# Patient Record
Sex: Female | Born: 1980 | Race: Black or African American | Hispanic: No | Marital: Single | Smoking: Former smoker
Health system: Southern US, Community
[De-identification: ages and names within clinical notes are randomized; demographics above are authoritative.]

## PROBLEM LIST (undated history)

## (undated) ENCOUNTER — Inpatient Hospital Stay (HOSPITAL_COMMUNITY): Payer: Self-pay

## (undated) DIAGNOSIS — T81509A Unspecified complication of foreign body accidentally left in body following unspecified procedure, initial encounter: Secondary | ICD-10-CM

## (undated) DIAGNOSIS — Z8619 Personal history of other infectious and parasitic diseases: Secondary | ICD-10-CM

## (undated) DIAGNOSIS — Z789 Other specified health status: Secondary | ICD-10-CM

## (undated) HISTORY — PX: TOOTH EXTRACTION: SUR596

## (undated) HISTORY — DX: Unspecified complication of foreign body accidentally left in body following unspecified procedure, initial encounter: T81.509A

## (undated) HISTORY — DX: Personal history of other infectious and parasitic diseases: Z86.19

## (undated) SURGERY — Surgical Case
Anesthesia: *Unknown

---

## 2013-03-05 ENCOUNTER — Encounter (HOSPITAL_BASED_OUTPATIENT_CLINIC_OR_DEPARTMENT_OTHER): Payer: Self-pay | Admitting: Emergency Medicine

## 2013-03-05 ENCOUNTER — Emergency Department (HOSPITAL_BASED_OUTPATIENT_CLINIC_OR_DEPARTMENT_OTHER)
Admission: EM | Admit: 2013-03-05 | Discharge: 2013-03-05 | Disposition: A | Discharge status: Home / Self Care | Payer: BC Managed Care – PPO | Attending: Emergency Medicine | Admitting: Emergency Medicine

## 2013-03-05 DIAGNOSIS — K006 Disturbances in tooth eruption: Secondary | ICD-10-CM | POA: Insufficient documentation

## 2013-03-05 DIAGNOSIS — K029 Dental caries, unspecified: Secondary | ICD-10-CM | POA: Insufficient documentation

## 2013-03-05 DIAGNOSIS — K089 Disorder of teeth and supporting structures, unspecified: Secondary | ICD-10-CM | POA: Insufficient documentation

## 2013-03-05 DIAGNOSIS — F17200 Nicotine dependence, unspecified, uncomplicated: Secondary | ICD-10-CM | POA: Insufficient documentation

## 2013-03-05 DIAGNOSIS — R51 Headache: Secondary | ICD-10-CM | POA: Insufficient documentation

## 2013-03-05 DIAGNOSIS — K0889 Other specified disorders of teeth and supporting structures: Secondary | ICD-10-CM

## 2013-03-05 MED ORDER — OXYCODONE-ACETAMINOPHEN 5-325 MG PO TABS: 2.0000 | ORAL_TABLET | ORAL | Status: DC | PRN

## 2013-03-05 MED ORDER — OXYCODONE-ACETAMINOPHEN 5-325 MG PO TABS
2.0000 | ORAL_TABLET | Freq: Once | ORAL | Status: AC
  Administered 2013-03-05: 2 via ORAL
  Filled 2013-03-05: qty 2

## 2013-03-05 MED ORDER — PENICILLIN V POTASSIUM 500 MG PO TABS: 500.0000 mg | ORAL_TABLET | Freq: Three times a day (TID) | ORAL | Status: DC

## 2013-03-05 NOTE — ED Notes (Signed)
Patient has cracked tooth  Lower right side

## 2013-03-05 NOTE — ED Provider Notes (Signed)
CSN: [id number]     Arrival date & time 03/05/13  [id number] History   First MD Initiated Contact with Patient 03/05/13 [phone]     No chief complaint on file.  (Consider location/radiation/quality/duration/timing/severity/associated sxs/prior Treatment) Patient is a 33 y.o. female presenting with tooth pain.  Dental Pain Location:  Lower Lower teeth location:  31/RL 2nd molar Quality:  Aching Severity:  Moderate Onset quality:  Gradual Timing:  Constant Progression:  Worsening Chronicity:  Recurrent Context: dental caries and poor dentition   Context: not abscess, cap still on, not crown fracture, not recent dental surgery and not trauma   Previous work-up:  Dental exam and root canal Relieved by:  Nothing Worsened by:  Cold food/drink Ineffective treatments:  Acetaminophen and NSAIDs Associated symptoms: headaches   Associated symptoms: no difficulty swallowing, no drooling, no facial pain, no facial swelling, no fever, no gum swelling, no neck pain, no neck swelling, no oral bleeding, no oral lesions and no trismus   Risk factors: lack of dental care and smoking   Risk factors: no alcohol problem and no chewing tobacco use     No past medical history on file. No past surgical history on file. No family history on file. History  Substance Use Topics  . Smoking status: Not on file  . Smokeless tobacco: Not on file  . Alcohol Use: Not on file   OB History   No data available     Review of Systems  Constitutional: Negative for fever.  HENT: Negative for drooling, facial swelling and mouth sores.   Musculoskeletal: Negative for neck pain.  Neurological: Positive for headaches.  All other systems reviewed and are negative.    Allergies  Review of patient's allergies indicates not on file.  Home Medications   Current Outpatient Rx  Name  Route  Sig  Dispense  Refill  . oxyCODONE-acetaminophen (PERCOCET/ROXICET) 5-325 MG per tablet   Oral   Take 2 tablets by mouth every 4  (four) hours as needed for severe pain.   15 tablet   0   . penicillin v potassium (VEETID) 500 MG tablet   Oral   Take 1 tablet (500 mg total) by mouth 3 (three) times daily.   30 tablet   0    BP 116/77  Pulse 78  Temp(Src) 98.4 F (36.9 C) (Oral)  Resp 16  Ht 5\' 7"  (1.702 m)  Wt 222 lb (100.699 kg)  BMI 34.76 kg/m2  SpO2 100% Physical Exam  Nursing note and vitals reviewed. Constitutional: She is oriented to person, place, and time. She appears well-developed and well-nourished.  HENT:  Head: Normocephalic and atraumatic.  Right Ear: External ear normal.  Left Ear: External ear normal.  Nose: Nose normal.  Mouth/Throat: Uvula is midline, oropharynx is clear and moist and mucous membranes are normal.    Caries right lower third molar and second molar  Eyes: Conjunctivae and EOM are normal. Pupils are equal, round, and reactive to light.  Neck: Normal range of motion. Neck supple. No JVD present. No tracheal deviation present. No thyromegaly present.  Cardiovascular: Normal rate.   Pulmonary/Chest: Effort normal and breath sounds normal.  Abdominal: Soft.  Musculoskeletal: Normal range of motion.  Lymphadenopathy:    She has no cervical adenopathy.  Neurological: She is alert and oriented to person, place, and time. She has normal reflexes. She displays normal reflexes. No cranial nerve deficit. She exhibits normal muscle tone. Coordination normal.  Skin: Skin is warm and dry.  Psychiatric:  She has a normal mood and affect. Her behavior is normal. Judgment and thought content normal.    ED Course  Procedures (including critical care time) Labs Review Labs Reviewed - No data to display Imaging Review No results found.  EKG Interpretation   None       MDM   1. Dental caries   2. Toothache   Patient states she is in contact with dentist and has appointment February, 24.  Plan penicillin percocet.  Discussed smoking cessation  Voices understanding of plan  and return precautions.     Hilario Quarry, MD 03/05/13 [phone]

## 2013-03-05 NOTE — Discharge Instructions (Signed)
Dental Caries Dental caries is tooth decay. This decay can cause a hole in teeth (cavity) that can get bigger and deeper over time. HOME CARE  Brush and floss your teeth. Do this at least two times a day.  Use a fluoride toothpaste.  Use a mouth rinse if told by your dentist or doctor.  Eat less sugary and starchy foods. Drink less sugary drinks.  Avoid snacking often on sugary and starchy foods. Avoid sipping often on sugary drinks.  Keep regular checkups and cleanings with your dentist.  Use fluoride supplements if told by your dentist or doctor.  Allow fluoride to be applied to teeth if told by your dentist or doctor. MAKE SURE YOU:  Understand these instructions.  Will watch your condition. Will get help right away if you are not doing well or getToothache Toothaches are usually caused by tooth decay (cavity). However, other causes of toothache include: Gum disease. Cracked tooth. Cracked filling. Injury. Jaw problem (temporo mandibular joint or TMJ disorder). Tooth abscess. Root sensitivity. Grinding. Eruption problems. Swelling and redness around a painful tooth often means you have a dental abscess. Pain medicine and antibiotics can help reduce symptoms, but you will need to see a dentist within the next few days to have your problem properly evaluated and treated. If tooth decay is the problem, you may need a filling or root canal to save your tooth. If the problem is more severe, your tooth may need to be pulled. SEEK IMMEDIATE MEDICAL CARE IF: You cannot swallow. You develop severe swelling, increased redness, or increased pain in your mouth or face. You have a fever. You cannot open your mouth adequately. Document Released: 03/03/2004 Document Revised: 04/18/2011 Document Reviewed: 04/23/2009 [facility]Exit[facility] Patient Information 2014 [place], [place].  orse. Document Released: 11/03/2007 Document Revised: 09/26/2012 Document Reviewed: 01/27/2012 [facility] Patient  Information 2014 [place], [place].

## 2013-03-19 ENCOUNTER — Encounter (HOSPITAL_BASED_OUTPATIENT_CLINIC_OR_DEPARTMENT_OTHER): Payer: Self-pay | Admitting: Emergency Medicine

## 2013-03-19 ENCOUNTER — Emergency Department (HOSPITAL_BASED_OUTPATIENT_CLINIC_OR_DEPARTMENT_OTHER)
Admission: EM | Admit: 2013-03-19 | Discharge: 2013-03-19 | Disposition: A | Discharge status: Home / Self Care | Payer: BC Managed Care – PPO | Attending: Emergency Medicine | Admitting: Emergency Medicine

## 2013-03-19 DIAGNOSIS — T360X5A Adverse effect of penicillins, initial encounter: Secondary | ICD-10-CM | POA: Insufficient documentation

## 2013-03-19 DIAGNOSIS — R42 Dizziness and giddiness: Secondary | ICD-10-CM | POA: Insufficient documentation

## 2013-03-19 DIAGNOSIS — Z792 Long term (current) use of antibiotics: Secondary | ICD-10-CM | POA: Insufficient documentation

## 2013-03-19 DIAGNOSIS — R221 Localized swelling, mass and lump, neck: Principal | ICD-10-CM

## 2013-03-19 DIAGNOSIS — R21 Rash and other nonspecific skin eruption: Secondary | ICD-10-CM | POA: Insufficient documentation

## 2013-03-19 DIAGNOSIS — T7840XA Allergy, unspecified, initial encounter: Secondary | ICD-10-CM

## 2013-03-19 DIAGNOSIS — R1310 Dysphagia, unspecified: Secondary | ICD-10-CM | POA: Insufficient documentation

## 2013-03-19 DIAGNOSIS — R062 Wheezing: Secondary | ICD-10-CM | POA: Insufficient documentation

## 2013-03-19 DIAGNOSIS — R220 Localized swelling, mass and lump, head: Secondary | ICD-10-CM | POA: Insufficient documentation

## 2013-03-19 DIAGNOSIS — F17200 Nicotine dependence, unspecified, uncomplicated: Secondary | ICD-10-CM | POA: Insufficient documentation

## 2013-03-19 MED ORDER — TRAMADOL HCL 50 MG PO TABS: 50.0000 mg | ORAL_TABLET | Freq: Four times a day (QID) | ORAL | Status: DC | PRN

## 2013-03-19 MED ORDER — METHYLPREDNISOLONE SODIUM SUCC 125 MG IJ SOLR
125.0000 mg | Freq: Once | INTRAMUSCULAR | Status: AC
  Administered 2013-03-19: 125 mg via INTRAVENOUS
  Filled 2013-03-19: qty 2

## 2013-03-19 MED ORDER — TRAMADOL HCL 50 MG PO TABS
50.0000 mg | ORAL_TABLET | Freq: Once | ORAL | Status: AC
  Administered 2013-03-19: 50 mg via ORAL
  Filled 2013-03-19: qty 1

## 2013-03-19 MED ORDER — SODIUM CHLORIDE 0.9 % IV SOLN
Freq: Once | INTRAVENOUS | Status: AC
  Administered 2013-03-19: 09:00:00 via INTRAVENOUS

## 2013-03-19 MED ORDER — HYDROXYZINE HCL 25 MG PO TABS: 25.0000 mg | ORAL_TABLET | Freq: Four times a day (QID) | ORAL | Status: DC

## 2013-03-19 MED ORDER — DIPHENHYDRAMINE HCL 50 MG/ML IJ SOLN
25.0000 mg | Freq: Once | INTRAMUSCULAR | Status: AC
  Administered 2013-03-19: 25 mg via INTRAVENOUS
  Filled 2013-03-19: qty 1

## 2013-03-19 MED ORDER — FAMOTIDINE IN NACL 20-0.9 MG/50ML-% IV SOLN
20.0000 mg | Freq: Once | INTRAVENOUS | Status: AC
  Administered 2013-03-19: 20 mg via INTRAVENOUS
  Filled 2013-03-19: qty 50

## 2013-03-19 MED ORDER — PREDNISONE 20 MG PO TABS: ORAL_TABLET | ORAL | Status: DC

## 2013-03-19 NOTE — ED Provider Notes (Signed)
CSN: [id number]     Arrival date & time 03/19/13  [id number] History   First MD Initiated Contact with Patient 03/19/13 0756     Chief Complaint  Patient presents with  . Allergic Reaction     (Consider location/radiation/quality/duration/timing/severity/associated sxs/prior Treatment) HPI Comments: Patient presents with rash and itching. She feels it's allergic reaction to amoxicillin. She recently took a course of penicillin for tooth ache and did not have any problems with that. She went to her dentist last week and was started on amoxicillin and 4 days ago. Shortly after that she started noticing a rash with itching to both of her arms and on her lower legs. She states her hands it started to swell up. Two days ago, she started noticing some fullness to her throat and difficulty swallowing. She denies any swelling to her lips or tongue. She does report some intermittent slight wheezing. She denies any history of allergic reactions in the past. She's been using Benadryl with some relief of the rash no improvement in the swelling. She denies any other new exposures other than the amoxicillin.  Patient is a 33 y.o. female presenting with allergic reaction.  Allergic Reaction Presenting symptoms: wheezing     No past medical history on file. No past surgical history on file. No family history on file. History  Substance Use Topics  . Smoking status: Current Every Day Smoker  . Smokeless tobacco: Not on file  . Alcohol Use: No   OB History   Grav Para Term Preterm Abortions TAB SAB Ect Mult Living                 Review of Systems  Constitutional: Negative for fever, chills, diaphoresis and fatigue.  HENT: Positive for facial swelling. Negative for congestion, rhinorrhea and sneezing.   Eyes: Negative.   Respiratory: Positive for wheezing. Negative for cough, chest tightness and shortness of breath.   Cardiovascular: Negative for chest pain and leg swelling.  Gastrointestinal: Negative  for nausea, vomiting, abdominal pain, diarrhea and blood in stool.  Genitourinary: Negative for frequency, hematuria, flank pain and difficulty urinating.  Musculoskeletal: Negative for arthralgias and back pain.  Neurological: Positive for light-headedness. Negative for dizziness, speech difficulty, weakness, numbness and headaches.      Allergies  Amoxicillin  Home Medications   Current Outpatient Rx  Name  Route  Sig  Dispense  Refill  . penicillin v potassium (VEETID) 500 MG tablet   Oral   Take 1 tablet (500 mg total) by mouth 3 (three) times daily.   30 tablet   0   . hydrOXYzine (ATARAX/VISTARIL) 25 MG tablet   Oral   Take 1 tablet (25 mg total) by mouth every 6 (six) hours.   12 tablet   0   . oxyCODONE-acetaminophen (PERCOCET/ROXICET) 5-325 MG per tablet   Oral   Take 2 tablets by mouth every 4 (four) hours as needed for severe pain.   15 tablet   0   . predniSONE (DELTASONE) 20 MG tablet      3 tabs po day one, then 2 po daily x 4 days   11 tablet   0   . traMADol (ULTRAM) 50 MG tablet   Oral   Take 1 tablet (50 mg total) by mouth every 6 (six) hours as needed.   15 tablet   0    BP 125/78  Pulse 84  Temp(Src) 98.8 F (37.1 C) (Oral)  Resp 18  SpO2 99%  LMP 03/18/2013  Physical Exam  Constitutional: She is oriented to person, place, and time. She appears well-developed and well-nourished.  HENT:  Head: Normocephalic and atraumatic.  No obvious facial swelling  Eyes: Pupils are equal, round, and reactive to light.  Neck: Normal range of motion. Neck supple.  Cardiovascular: Normal rate, regular rhythm and normal heart sounds.   Pulmonary/Chest: Effort normal and breath sounds normal. No respiratory distress. She has no wheezes. She has no rales. She exhibits no tenderness.  Abdominal: Soft. Bowel sounds are normal. There is no tenderness. There is no rebound and no guarding.  Musculoskeletal: Normal range of motion. She exhibits no edema.   Lymphadenopathy:    She has no cervical adenopathy.  Neurological: She is alert and oriented to person, place, and time.  Skin: Skin is warm and dry. Rash (She has a slightly raised erythematous rash on the flexor surfaces of both arms. She does have some edema of both hands. The rash is blanching. There's no petechiae or purpura. No vesicles.) noted.  Psychiatric: She has a normal mood and affect.    ED Course  Procedures (including critical care time) Labs Review Labs Reviewed - No data to display Imaging Review No results found.  EKG Interpretation   None       MDM   Final diagnoses:  Allergic reaction    Patient presents with an allergic reaction, presumably to amoxicillin. She was given Solu-Medrol, Benadryl, and Pepcid in the ED. She's feeling better with less itching. She did not appear to have any angioedema or airway issues. She has no wheezing or shortness of breath currently. She was discharged home in good condition and advised not to take anymore and penicillin-type antibiotics. She was given a prescription for a five-day course of prednisone as well as Atarax and Ultram for her dental pain. She was encouraged to return to the emergency department if her symptoms worsen.    Rolan BuccoMelanie Omya Winfield, MD 03/19/13 [phone]

## 2013-03-19 NOTE — ED Notes (Signed)
Pt started to have reaction to amoxicillin on Sunday for tooth infection.  Pt states she has hives, itching and swelling of the hands.  Some swallowing difficulties but no airway impairment.

## 2013-03-19 NOTE — Discharge Instructions (Signed)

## 2015-02-07 ENCOUNTER — Encounter (HOSPITAL_COMMUNITY): Payer: Self-pay | Admitting: *Deleted

## 2015-02-07 ENCOUNTER — Inpatient Hospital Stay (HOSPITAL_COMMUNITY)
Admission: AD | Admit: 2015-02-07 | Discharge: 2015-02-07 | Disposition: A | Discharge status: Home / Self Care | Payer: BLUE CROSS/BLUE SHIELD | Source: Ambulatory Visit | Attending: Obstetrics and Gynecology | Admitting: Obstetrics and Gynecology

## 2015-02-07 ENCOUNTER — Inpatient Hospital Stay (HOSPITAL_COMMUNITY): Payer: BLUE CROSS/BLUE SHIELD

## 2015-02-07 DIAGNOSIS — Z30432 Encounter for removal of intrauterine contraceptive device: Secondary | ICD-10-CM | POA: Diagnosis not present

## 2015-02-07 DIAGNOSIS — O9989 Other specified diseases and conditions complicating pregnancy, childbirth and the puerperium: Secondary | ICD-10-CM

## 2015-02-07 DIAGNOSIS — F17200 Nicotine dependence, unspecified, uncomplicated: Secondary | ICD-10-CM | POA: Diagnosis not present

## 2015-02-07 DIAGNOSIS — T8331XA Breakdown (mechanical) of intrauterine contraceptive device, initial encounter: Secondary | ICD-10-CM

## 2015-02-07 DIAGNOSIS — Z3201 Encounter for pregnancy test, result positive: Secondary | ICD-10-CM | POA: Diagnosis not present

## 2015-02-07 NOTE — MAU Provider Note (Addendum)
  History     CSN: [id number]  Arrival date and time: 02/07/15 [id number]  Pt with IUD removal 01/07/15, arm broken.  Hysteroscopy attempted removal of IUD arm and paragard placement 01/29/15.  With +UPT and HCG of 497.  To MAU for eval.  US reveals IUD in place.  Poss CL, also arm in post myometrium.    D/W pt pregnancy and IUD - will remove and follow quants.  D/w pt possibility of ectopic pregnancy and precautions.  Also d/w pt possibility of ongoing pregnancy complication.       Chief Complaint  Patient presents with  . Pregnancy Ultrasound   HPI  Pertinent Gynecological History: G2P2 SVD x 2 No abn pap No STD  History reviewed. No pertinent past medical history.  Past Surgical History  Procedure Laterality Date  . Tooth extraction      No family history   Social History  Substance Use Topics  . Smoking status: Current Every Day Smoker  . Smokeless tobacco: None  . Alcohol Use: No    Allergies:  Allergies  Allergen Reactions  . Amoxicillin Hives, Itching and Swelling    No prescriptions prior to admission    ROS neg Physical Exam   Blood pressure 125/76, pulse 75, temperature 98.2 F (36.8 C), temperature source Oral, resp. rate 18, height 5\' 6"  (1.676 m), weight 92.987 kg (205 lb), last menstrual period 01/12/2015.  Physical Exam  Constitutional: She appears well-developed and well-nourished.  Genitourinary: Vagina normal and uterus normal.   Speculum inserted, IUD strings seen.  IUD strings grasped and removed in entirity, shown to patient MAU Course  Procedures  US reveal well placed Paragard IUD, also "arm" in posterior myometrium, poss CL cyst.  No ectopic, also no definite IUP.  [facility] 12/30  Assessment and Plan  34yo G3P2002 with paragard IUD and + UPT, HCG in office 497 US - IUD in place, arm seen in posterior myometrium, CL on ovary IUD removed HCG tomorrow and Tuesday  Bovard-Stuckert, Deonte Otting 02/07/2015, 12:00 PM

## 2015-02-07 NOTE — MAU Note (Signed)
Patient states she had an IUD placed 12/22 in office. Now has + HPT and + HCG in office yesterday. Was called later yesterday and advised to come to MAU today for U/S.

## 2015-02-08 ENCOUNTER — Inpatient Hospital Stay (HOSPITAL_COMMUNITY)
Admission: AD | Admit: 2015-02-08 | Discharge: 2015-02-08 | Disposition: A | Discharge status: Home / Self Care | Payer: BLUE CROSS/BLUE SHIELD | Source: Ambulatory Visit | Attending: Obstetrics and Gynecology | Admitting: Obstetrics and Gynecology

## 2015-02-08 DIAGNOSIS — Z3A01 Less than 8 weeks gestation of pregnancy: Secondary | ICD-10-CM | POA: Diagnosis not present

## 2015-02-08 DIAGNOSIS — Z3201 Encounter for pregnancy test, result positive: Secondary | ICD-10-CM

## 2015-02-08 DIAGNOSIS — Z975 Presence of (intrauterine) contraceptive device: Secondary | ICD-10-CM | POA: Diagnosis not present

## 2015-02-08 DIAGNOSIS — O3680X0 Pregnancy with inconclusive fetal viability, not applicable or unspecified: Secondary | ICD-10-CM

## 2015-02-08 LAB — HCG, QUANTITATIVE, PREGNANCY: hCG, Beta Chain, Quant, S: 1258 m[IU]/mL — ABNORMAL HIGH (ref <5)

## 2015-02-08 NOTE — MAU Note (Signed)
Pt called, not in lobby 

## 2015-02-08 NOTE — Discharge Instructions (Signed)
First Trimester of Pregnancy The first trimester of pregnancy is from week 1 until the end of week 12 (months 1 through 3). A week after a sperm fertilizes an egg, the egg will implant on the wall of the uterus. This embryo will begin to develop into a baby. Genes from you and your partner are forming the baby. The female genes determine whether the baby is a boy or a girl. At 6-8 weeks, the eyes and face are formed, and the heartbeat can be seen on ultrasound. At the end of 12 weeks, all the baby's organs are formed.  Now that you are pregnant, you will want to do everything you can to have a healthy baby. Two of the most important things are to get good prenatal care and to follow your health care provider's instructions. Prenatal care is all the medical care you receive before the baby's birth. This care will help prevent, find, and treat any problems during the pregnancy and childbirth. BODY CHANGES Your body goes through many changes during pregnancy. The changes vary from woman to woman.   You may gain or lose a couple of pounds at first.  You may feel sick to your stomach (nauseous) and throw up (vomit). If the vomiting is uncontrollable, call your health care provider.  You may tire easily.  You may develop headaches that can be relieved by medicines approved by your health care provider.  You may urinate more often. Painful urination may mean you have a bladder infection.  You may develop heartburn as a result of your pregnancy.  You may develop constipation because certain hormones are causing the muscles that push waste through your intestines to slow down.  You may develop hemorrhoids or swollen, bulging veins (varicose veins).  Your breasts may begin to grow larger and become tender. Your nipples may stick out more, and the tissue that surrounds them (areola) may become darker.  Your gums may bleed and may be sensitive to brushing and flossing.  Dark spots or blotches (chloasma,  mask of pregnancy) may develop on your face. This will likely fade after the baby is born.  Your menstrual periods will stop.  You may have a loss of appetite.  You may develop cravings for certain kinds of food.  You may have changes in your emotions from day to day, such as being excited to be pregnant or being concerned that something may go wrong with the pregnancy and baby.  You may have more vivid and strange dreams.  You may have changes in your hair. These can include thickening of your hair, rapid growth, and changes in texture. Some women also have hair loss during or after pregnancy, or hair that feels dry or thin. Your hair will most likely return to normal after your baby is born. WHAT TO EXPECT AT YOUR PRENATAL VISITS During a routine prenatal visit:  You will be weighed to make sure you and the baby are growing normally.  Your blood pressure will be taken.  Your abdomen will be measured to track your baby's growth.  The fetal heartbeat will be listened to starting around week 10 or 12 of your pregnancy.  Test results from any previous visits will be discussed. Your health care provider may ask you:  How you are feeling.  If you are feeling the baby move.  If you have had any abnormal symptoms, such as leaking fluid, bleeding, severe headaches, or abdominal cramping.  If you are using any tobacco products,   including cigarettes, chewing tobacco, and electronic cigarettes.  If you have any questions. Other tests that may be performed during your first trimester include:  Blood tests to find your blood type and to check for the presence of any previous infections. They will also be used to check for low iron levels (anemia) and Rh antibodies. Later in the pregnancy, blood tests for diabetes will be done along with other tests if problems develop.  Urine tests to check for infections, diabetes, or protein in the urine.  An ultrasound to confirm the proper growth  and development of the baby.  An amniocentesis to check for possible genetic problems.  Fetal screens for spina bifida and Down syndrome.  You may need other tests to make sure you and the baby are doing well.  HIV (human immunodeficiency virus) testing. Routine prenatal testing includes screening for HIV, unless you choose not to have this test. HOME CARE INSTRUCTIONS  Medicines  Follow your health care provider's instructions regarding medicine use. Specific medicines may be either safe or unsafe to take during pregnancy.  Take your prenatal vitamins as directed.  If you develop constipation, try taking a stool softener if your health care provider approves. Diet  Eat regular, well-balanced meals. Choose a variety of foods, such as meat or vegetable-based protein, fish, milk and low-fat dairy products, vegetables, fruits, and whole grain breads and cereals. Your health care provider will help you determine the amount of weight gain that is right for you.  Avoid raw meat and uncooked cheese. These carry germs that can cause birth defects in the baby.  Eating four or five small meals rather than three large meals a day may help relieve nausea and vomiting. If you start to feel nauseous, eating a few soda crackers can be helpful. Drinking liquids between meals instead of during meals also seems to help nausea and vomiting.  If you develop constipation, eat more high-fiber foods, such as fresh vegetables or fruit and whole grains. Drink enough fluids to keep your urine clear or pale yellow. Activity and Exercise  Exercise only as directed by your health care provider. Exercising will help you:  Control your weight.  Stay in shape.  Be prepared for labor and delivery.  Experiencing pain or cramping in the lower abdomen or low back is a good sign that you should stop exercising. Check with your health care provider before continuing normal exercises.  Try to avoid standing for long  periods of time. Move your legs often if you must stand in one place for a long time.  Avoid heavy lifting.  Wear low-heeled shoes, and practice good posture.  You may continue to have sex unless your health care provider directs you otherwise. Relief of Pain or Discomfort  Wear a good support bra for breast tenderness.   Take warm sitz baths to soothe any pain or discomfort caused by hemorrhoids. Use hemorrhoid cream if your health care provider approves.   Rest with your legs elevated if you have leg cramps or low back pain.  If you develop varicose veins in your legs, wear support hose. Elevate your feet for 15 minutes, 3-4 times a day. Limit salt in your diet. Prenatal Care  Schedule your prenatal visits by the twelfth week of pregnancy. They are usually scheduled monthly at first, then more often in the last 2 months before delivery.  Write down your questions. Take them to your prenatal visits.  Keep all your prenatal visits as directed by your   health care provider. Safety  Wear your seat belt at all times when driving.  Make a list of emergency phone numbers, including numbers for family, friends, the hospital, and police and fire departments. General Tips  Ask your health care provider for a referral to a local prenatal education class. Begin classes no later than at the beginning of month 6 of your pregnancy.  Ask for help if you have counseling or nutritional needs during pregnancy. Your health care provider can offer advice or refer you to specialists for help with various needs.  Do not use hot tubs, steam rooms, or saunas.  Do not douche or use tampons or scented sanitary pads.  Do not cross your legs for long periods of time.  Avoid cat litter boxes and soil used by cats. These carry germs that can cause birth defects in the baby and possibly loss of the fetus by miscarriage or stillbirth.  Avoid all smoking, herbs, alcohol, and medicines not prescribed by  your health care provider. Chemicals in these affect the formation and growth of the baby.  Do not use any tobacco products, including cigarettes, chewing tobacco, and electronic cigarettes. If you need help quitting, ask your health care provider. You may receive counseling support and other resources to help you quit.  Schedule a dentist appointment. At home, brush your teeth with a soft toothbrush and be gentle when you floss. SEEK MEDICAL CARE IF:   You have dizziness.  You have mild pelvic cramps, pelvic pressure, or nagging pain in the abdominal area.  You have persistent nausea, vomiting, or diarrhea.  You have a bad smelling vaginal discharge.  You have pain with urination.  You notice increased swelling in your face, hands, legs, or ankles. SEEK IMMEDIATE MEDICAL CARE IF:   You have a fever.  You are leaking fluid from your vagina.  You have spotting or bleeding from your vagina.  You have severe abdominal cramping or pain.  You have rapid weight gain or loss.  You vomit blood or material that looks like coffee grounds.  You are exposed to German measles and have never had them.  You are exposed to fifth disease or chickenpox.  You develop a severe headache.  You have shortness of breath.  You have any kind of trauma, such as from a fall or a car accident.   This information is not intended to replace advice given to you by your health care provider. Make sure you discuss any questions you have with your health care provider.   Document Released: 01/18/2001 Document Revised: 02/14/2014 Document Reviewed: 12/04/2012 [employer] Interactive Patient Education 2016 [employer]  

## 2015-02-08 NOTE — MAU Provider Note (Signed)
History   Chief Complaint:  No chief complaint on file.   Kristina Yu is  35 y.o. [id number] Patient's last menstrual period was 01/12/2015 (approximate).. Patient is here for follow up of quantitative HCG and ongoing surveillance of pregnancy status.   She is [redacted]w[redacted]d weeks gestation  by LMP.    Since her last visit, the patient is without new complaint.   The patient reports bleeding as  none now.  Instructed to follow up here by Dr Hinton Rao.  General ROS:  negative  Her previous Quantitative HCG values are: 497  DR BOVARD'S NOTE: Pt with IUD removal 01/07/15, arm broken. Hysteroscopy attempted removal of IUD arm and paragard placement 01/29/15. With +UPT and HCG of 497. To MAU for eval. US reveals IUD in place. Poss CL, also arm in post myometrium. D/W pt pregnancy and IUD - will remove and follow quants. D/w pt possibility of ectopic pregnancy and precautions. Also d/w pt possibility of ongoing pregnancy complication.     Physical Exam   Last menstrual period 01/12/2015.  Focused Gynecological Exam: examination not indicated  Labs: Results for orders placed or performed during the hospital encounter of 02/08/15 (from the past 24 hour(s))  hCG, quantitative, pregnancy     Status: Abnormal   Collection Time: 02/08/15  3:28 PM  Result Value Ref Range   hCG, Beta Chain, Quant, S 1258 (H) <5 mIU/mL        HCG                   02/06/15                                    497  Ultrasound Studies:   US Ob Comp Less 14 Wks  02/07/2015  CLINICAL DATA:  Positive pregnancy test, intrauterine device. EXAM: OBSTETRIC <14 WK [place] AND TRANSVAGINAL OB US TECHNIQUE: Both transabdominal and transvaginal ultrasound examinations were performed for complete evaluation of the gestation as well as the maternal uterus, adnexal regions, and pelvic cul-de-sac. Transvaginal technique was performed to assess early pregnancy. COMPARISON:  None. FINDINGS: Intrauterine gestational sac: Not  visualized. Yolk sac:  Not visualized. Embryo:  Not visualized. Cardiac Activity: Not visualized. Maternal uterus/adnexae: Intrauterine device is noted in endometrial canal. And other linear echogenicity is noted transversely in the posterior myometrium which may represent remnant of previous intrauterine device. Small amount of free fluid is noted which may be physiologic. Left ovary is not visualized due to overlying bowel gas. Cyst is noted in right ovary most consistent with corpus luteum. IMPRESSION: Intrauterine device is noted in endometrial canal, with probable remnant of previous intrauterine device seen in posterior myometrium. No intrauterine fluid collection or gestational sac is noted. In a pregnant patient, the differential includes early intrauterine gestation, missed abortion or nonvisualized ectopic pregnancy. Correlation with beta HCG levels as well as follow-up ultrasound in 7-10 days is recommended. Electronically Signed   By: Lupita Raider, M.D.   On: 02/07/2015 09:41   US Ob Transvaginal  02/07/2015  CLINICAL DATA:  Positive pregnancy test, intrauterine device. EXAM: OBSTETRIC <14 WK [place] AND TRANSVAGINAL OB US TECHNIQUE: Both transabdominal and transvaginal ultrasound examinations were performed for complete evaluation of the gestation as well as the maternal uterus, adnexal regions, and pelvic cul-de-sac. Transvaginal technique was performed to assess early pregnancy. COMPARISON:  None. FINDINGS: Intrauterine gestational sac: Not visualized. Yolk sac:  Not visualized. Embryo:  Not visualized. Cardiac Activity: Not visualized. Maternal uterus/adnexae: Intrauterine device is noted in endometrial canal. And other linear echogenicity is noted transversely in the posterior myometrium which may represent remnant of previous intrauterine device. Small amount of free fluid is noted which may be physiologic. Left ovary is not visualized due to overlying bowel gas. Cyst is noted in right ovary most  consistent with corpus luteum. IMPRESSION: Intrauterine device is noted in endometrial canal, with probable remnant of previous intrauterine device seen in posterior myometrium. No intrauterine fluid collection or gestational sac is noted. In a pregnant patient, the differential includes early intrauterine gestation, missed abortion or nonvisualized ectopic pregnancy. Correlation with beta HCG levels as well as follow-up ultrasound in 7-10 days is recommended. Electronically Signed   By: Lupita RaiderJames  Green Jr, M.D.   On: 02/07/2015 09:41    Assessment:  21103w6d weeks gestation   Conception with IUD in place No bleeding or pain   Plan: The patient is instructed to follow up in in 2 days  Patient was not in lobby when I went to tell her about her results Charge nurse notified and will be on the lookout for her  [facility]ILL[facility],Daymond Cordts 02/08/2015, 3:21 PM

## 2015-02-08 NOTE — L&D Delivery Note (Signed)
Delivery Note Pt finally reached complete dilation and pushed well.  At 4:02 AM a healthy female was delivered via Vaginal, Spontaneous Delivery (Presentation: OA  ).  APGAR:8 ,9 ; weight pending .   Placenta status:delivered spontaneously , .  Cord:  with the following complications: none .  Anesthesia:  epidural Episiotomy: None Lacerations:  none Suture Repair: n/a Est. Blood Loss (mL):  275ml  Mom to postpartum.  Baby to Couplet care / Skin to Skin.  Oliver PilaICHARDSON,Avan Gullett W 10/06/2015, 4:22 AM

## 2015-02-08 NOTE — MAU Note (Signed)
Pt not in lobby.  

## 2015-03-11 LAB — OB RESULTS CONSOLE GC/CHLAMYDIA
Chlamydia: NEGATIVE
GC PROBE AMP, GENITAL: NEGATIVE

## 2015-03-11 LAB — OB RESULTS CONSOLE ABO/RH: RH Type: POSITIVE

## 2015-03-11 LAB — OB RESULTS CONSOLE HIV ANTIBODY (ROUTINE TESTING): HIV: NONREACTIVE

## 2015-03-11 LAB — OB RESULTS CONSOLE RUBELLA ANTIBODY, IGM: Rubella: IMMUNE

## 2015-03-11 LAB — OB RESULTS CONSOLE RPR: RPR: NONREACTIVE

## 2015-03-11 LAB — OB RESULTS CONSOLE HEPATITIS B SURFACE ANTIGEN: Hepatitis B Surface Ag: NEGATIVE

## 2015-03-11 LAB — OB RESULTS CONSOLE ANTIBODY SCREEN: Antibody Screen: NEGATIVE

## 2015-03-27 ENCOUNTER — Inpatient Hospital Stay (HOSPITAL_COMMUNITY)
Admission: AD | Admit: 2015-03-27 | Discharge: 2015-03-27 | Disposition: A | Discharge status: Home / Self Care | Payer: BLUE CROSS/BLUE SHIELD | Source: Ambulatory Visit | Attending: Obstetrics and Gynecology | Admitting: Obstetrics and Gynecology

## 2015-03-27 ENCOUNTER — Inpatient Hospital Stay (HOSPITAL_COMMUNITY): Payer: BLUE CROSS/BLUE SHIELD

## 2015-03-27 ENCOUNTER — Encounter (HOSPITAL_COMMUNITY): Payer: Self-pay | Admitting: *Deleted

## 2015-03-27 DIAGNOSIS — F17200 Nicotine dependence, unspecified, uncomplicated: Secondary | ICD-10-CM | POA: Diagnosis not present

## 2015-03-27 DIAGNOSIS — O43891 Other placental disorders, first trimester: Secondary | ICD-10-CM | POA: Diagnosis not present

## 2015-03-27 DIAGNOSIS — Z3A12 12 weeks gestation of pregnancy: Secondary | ICD-10-CM | POA: Diagnosis not present

## 2015-03-27 DIAGNOSIS — O99331 Smoking (tobacco) complicating pregnancy, first trimester: Secondary | ICD-10-CM | POA: Insufficient documentation

## 2015-03-27 DIAGNOSIS — Z880 Allergy status to penicillin: Secondary | ICD-10-CM | POA: Diagnosis not present

## 2015-03-27 DIAGNOSIS — O209 Hemorrhage in early pregnancy, unspecified: Secondary | ICD-10-CM | POA: Diagnosis present

## 2015-03-27 DIAGNOSIS — O418X10 Other specified disorders of amniotic fluid and membranes, first trimester, not applicable or unspecified: Secondary | ICD-10-CM

## 2015-03-27 DIAGNOSIS — O4690 Antepartum hemorrhage, unspecified, unspecified trimester: Secondary | ICD-10-CM

## 2015-03-27 DIAGNOSIS — O468X1 Other antepartum hemorrhage, first trimester: Secondary | ICD-10-CM

## 2015-03-27 DIAGNOSIS — O09521 Supervision of elderly multigravida, first trimester: Secondary | ICD-10-CM | POA: Diagnosis not present

## 2015-03-27 HISTORY — DX: Other specified health status: Z78.9

## 2015-03-27 LAB — URINALYSIS, ROUTINE W REFLEX MICROSCOPIC
Bilirubin Urine: NEGATIVE
GLUCOSE, UA: NEGATIVE mg/dL
Ketones, ur: 15 mg/dL — AB
Leukocytes, UA: NEGATIVE
Nitrite: NEGATIVE
PH: 7 (ref 5.0–8.0)
Protein, ur: NEGATIVE mg/dL
Specific Gravity, Urine: 1.015 (ref 1.005–1.030)

## 2015-03-27 LAB — URINE MICROSCOPIC-ADD ON: BACTERIA UA: NONE SEEN

## 2015-03-27 NOTE — MAU Note (Addendum)
Patient presents with onset of vaginal bleeding a short time ago (30 minutes) has toilet tissue, went to bathroom a [place] bit ago and didn't see any, denies pain. Used Metrogel last night because thought has BV

## 2015-03-27 NOTE — Discharge Instructions (Signed)
Pelvic Rest °Pelvic rest is sometimes recommended for women when:  °· The placenta is partially or completely covering the opening of the cervix (placenta previa). °· There is bleeding between the uterine wall and the amniotic sac in the first trimester (subchorionic hemorrhage). °· The cervix begins to open without labor starting (incompetent cervix, cervical insufficiency). °· The labor is too early (preterm labor). °HOME CARE INSTRUCTIONS °· Do not have sexual intercourse, stimulation, or an orgasm. °· Do not use tampons, douche, or put anything in the vagina. °· Do not lift anything over 10 pounds (4.5 kg). °· Avoid strenuous activity or straining your pelvic muscles. °SEEK MEDICAL CARE IF:  °· You have any vaginal bleeding during pregnancy. Treat this as a potential emergency. °· You have cramping pain felt low in the stomach (stronger than menstrual cramps). °· You notice vaginal discharge (watery, mucus, or bloody). °· You have a low, dull backache. °· There are regular contractions or uterine tightening. °SEEK IMMEDIATE MEDICAL CARE IF: °You have vaginal bleeding and have placenta previa.  °  °This information is not intended to replace advice given to you by your health care provider. Make sure you discuss any questions you have with your health care provider. °  °Document Released: 05/21/2010 Document Revised: 04/18/2011 Document Reviewed: 07/28/2014 °Elsevier Interactive Patient Education ©2016 [employer] Inc[employer] °Subchorionic Hematoma °A subchorionic hematoma is a gathering of blood between the outer wall of the placenta and the inner wall of the womb (uterus). The placenta is the organ that connects the fetus to the wall of the uterus. The placenta performs the feeding, breathing (oxygen to the fetus), and waste removal (excretory work) of the fetus.  °Subchorionic hematoma is the most common abnormality found on a result from ultrasonography done during the first trimester or early second trimester of  pregnancy. If there has been [place] or no vaginal bleeding, early small hematomas usually shrink on their own and do not affect your baby or pregnancy. The blood is gradually absorbed over 1-2 weeks. When bleeding starts later in pregnancy or the hematoma is larger or occurs in an older pregnant woman, the outcome may not be as good. Larger hematomas may get bigger, which increases the chances for miscarriage. Subchorionic hematoma also increases the risk of premature detachment of the placenta from the uterus, preterm (premature) labor, and stillbirth. °HOME CARE INSTRUCTIONS °· Stay on bed rest if your health care provider recommends this. Although bed rest will not prevent more bleeding or prevent a miscarriage, your health care provider may recommend bed rest until you are advised otherwise. °· Avoid heavy lifting (more than 10 lb [4.5 kg]), exercise, sexual intercourse, or douching as directed by your health care provider. °· Keep track of the number of pads you use each day and how soaked (saturated) they are. Write down this information. °· Do not use tampons. °· Keep all follow-up appointments as directed by your health care provider. Your health care provider may ask you to have follow-up blood tests or ultrasound tests or both. °SEEK IMMEDIATE MEDICAL CARE IF: °· You have severe cramps in your stomach, back, abdomen, or pelvis. °· You have a fever. °· You pass large clots or tissue. Save any tissue for your health care provider to look at. °· Your bleeding increases or you become lightheaded, feel weak, or have fainting episodes. °  °This information is not intended to replace advice given to you by your health care provider. Make sure you discuss any questions you have with   your health care provider. °  °Document Released: 05/11/2006 Document Revised: 02/14/2014 Document Reviewed: 08/23/2012 °Elsevier Interactive Patient Education ©2016 Elsevier Inc. ° °

## 2015-03-27 NOTE — MAU Provider Note (Signed)
History     CSN: [id number]  Arrival date and time: 03/27/15 1719   First Provider Initiated Contact with Patient 03/27/15 1813      Chief Complaint  Patient presents with  . Vaginal Bleeding   HPI [name] is a 35 y.o. G3P2002 at [redacted]w[redacted]d who presents to MAU today with complaint of vaginal bleeding. The patient states that she works at the [employer] and lifted something "heavier than she should have" and then noted a small "gush" of blood. She states that by the time she left work and returned home the bleeding was very light and upon arrival in MAU she denies current bleeding. She states that she called the office yesterday about vaginal discharge and was told to use Metrogel which she placed once last night. She denies abdominal pain, fever or UTI symptoms. She has had some nausea without vomiting, diarrhea or constipation. She states that she had an [place] in the office that confirmed IUP on 03/11/15 and that gave her a due date of 10/09/15.   OB History    Gravida Para Term Preterm AB TAB SAB Ectopic Multiple Living   Past Medical History  Diagnosis Date  . Medical history non-contributory     Past Surgical History  Procedure Laterality Date  . Tooth extraction      History reviewed. No pertinent family history.  Social History  Substance Use Topics  . Smoking status: Current Every Day Smoker  . Smokeless tobacco: None  . Alcohol Use: No    Allergies:  Allergies  Allergen Reactions  . Amoxicillin Hives, Itching and Swelling    Has patient had a PCN reaction causing immediate rash, facial/tongue/throat swelling, SOB or lightheadedness with hypotension: Yes Has patient had a PCN reaction causing severe rash involving mucus membranes or skin necrosis: No Has patient had a PCN reaction that required hospitalization No Has patient had a PCN reaction occurring within the last 10 years: Yes If all of the above answers are "NO", then may proceed  with Cephalosporin use.     Prescriptions prior to admission  Medication Sig Dispense Refill Last Dose  . IRON PO Take 1 tablet by mouth daily.   03/26/2015 at Unknown time  . metroNIDAZOLE (METROGEL) 0.75 % vaginal gel Place 1 Applicatorful vaginally at bedtime.   03/26/2015 at Unknown time  . Prenatal Vit-Fe Fumarate-FA (PRENATAL MULTIVITAMIN) TABS tablet Take 1 tablet by mouth daily at 12 noon.   03/26/2015 at Unknown time  . hydrOXYzine (ATARAX/VISTARIL) 25 MG tablet Take 1 tablet (25 mg total) by mouth every 6 (six) hours. (Patient not taking: Reported on 02/07/2015) 12 tablet 0   . penicillin v potassium (VEETID) 500 MG tablet Take 1 tablet (500 mg total) by mouth 3 (three) times daily. (Patient not taking: Reported on 02/07/2015) 30 tablet 0 Past Week at Unknown time  . predniSONE (DELTASONE) 20 MG tablet 3 tabs po day one, then 2 po daily x 4 days (Patient not taking: Reported on 02/07/2015) 11 tablet 0     Review of Systems  Constitutional: Negative for fever and malaise/fatigue.  Gastrointestinal: Negative for nausea, vomiting, abdominal pain, diarrhea and constipation.  Genitourinary: Negative for dysuria, urgency and frequency.       + spotting   Physical Exam   Blood pressure 116/81, pulse 52, temperature 98.2 F (36.8 C), temperature source Oral, resp. rate 16, height  (1.676  m), weight 218 lb 3.2 oz (98.975 kg), last menstrual period 01/12/2015.  Physical Exam  Nursing note and vitals reviewed. Constitutional: She is oriented to person, place, and time. She appears well-developed and well-nourished. No distress.  HENT:  Head: Normocephalic and atraumatic.  Cardiovascular: Normal rate.   Respiratory: Effort normal.  GI: Soft. She exhibits no distension and no mass. There is no tenderness. There is no rebound and no guarding.  Neurological: She is alert and oriented to person, place, and time.  Skin: Skin is warm and dry. No erythema.  Psychiatric: She has a normal  mood and affect.    Results for orders placed or performed during the hospital encounter of 03/27/15 (from the past 24 hour(s))  Urinalysis, Routine w reflex microscopic (not at [facility])     Status: Abnormal   Collection Time: 03/27/15  5:35 PM  Result Value Ref Range   Color, Urine YELLOW YELLOW   APPearance CLEAR CLEAR   Specific Gravity, Urine 1.015 1.005 - 1.030   pH 7.0 5.0 - 8.0   Glucose, UA NEGATIVE NEGATIVE mg/dL   Hgb urine dipstick LARGE (A) NEGATIVE   Bilirubin Urine NEGATIVE NEGATIVE   Ketones, ur 15 (A) NEGATIVE mg/dL   Protein, ur NEGATIVE NEGATIVE mg/dL   Nitrite NEGATIVE NEGATIVE   Leukocytes, UA NEGATIVE NEGATIVE  Urine microscopic-add on     Status: Abnormal   Collection Time: 03/27/15  5:35 PM  Result Value Ref Range   Squamous Epithelial / LPF 0-5 (A) NONE SEEN   WBC, UA 0-5 0 - 5 WBC/hpf   RBC / HPF 0-5 0 - 5 RBC/hpf   Bacteria, UA NONE SEEN NONE SEEN    MAU Course  Procedures None  MDM UA today Unable to obtain FHTs with doppler US ordered to evaluate bleeding and assess viability Discussed patient with Dr. Mindi Slicker. Agrees with plan for discharge at this time with bleeding precautions and pelvic rest.  Patient's blood type is not on file in Epic, patient denies need for Rhogam with previous pregnancies Assessment and Plan  A: SIUP at [redacted]w[redacted]d Small subchorionic hemorrhage  P: Discharge home Tylenol PRN for pain advised Bleeding precautions discussed Patient advised to follow-up with [facility] as scheduled for routine prenatal care or sooner PRN Patient may return to MAU as needed or if her condition were to change or worsen   Marny Lowenstein, PA-C  03/27/2015, 7:28 PM

## 2015-06-21 ENCOUNTER — Encounter (HOSPITAL_COMMUNITY): Payer: Self-pay | Admitting: Certified Nurse Midwife

## 2015-06-21 ENCOUNTER — Inpatient Hospital Stay (HOSPITAL_COMMUNITY)
Admission: AD | Admit: 2015-06-21 | Discharge: 2015-06-21 | Disposition: A | Discharge status: Home / Self Care | Payer: BLUE CROSS/BLUE SHIELD | Source: Ambulatory Visit | Attending: Obstetrics and Gynecology | Admitting: Obstetrics and Gynecology

## 2015-06-21 DIAGNOSIS — Z880 Allergy status to penicillin: Secondary | ICD-10-CM | POA: Diagnosis not present

## 2015-06-21 DIAGNOSIS — R102 Pelvic and perineal pain: Secondary | ICD-10-CM | POA: Diagnosis not present

## 2015-06-21 DIAGNOSIS — N949 Unspecified condition associated with female genital organs and menstrual cycle: Secondary | ICD-10-CM

## 2015-06-21 DIAGNOSIS — Z3A24 24 weeks gestation of pregnancy: Secondary | ICD-10-CM | POA: Diagnosis not present

## 2015-06-21 DIAGNOSIS — O9989 Other specified diseases and conditions complicating pregnancy, childbirth and the puerperium: Secondary | ICD-10-CM | POA: Diagnosis not present

## 2015-06-21 DIAGNOSIS — O26892 Other specified pregnancy related conditions, second trimester: Secondary | ICD-10-CM | POA: Insufficient documentation

## 2015-06-21 DIAGNOSIS — O99332 Smoking (tobacco) complicating pregnancy, second trimester: Secondary | ICD-10-CM | POA: Insufficient documentation

## 2015-06-21 DIAGNOSIS — O368120 Decreased fetal movements, second trimester, not applicable or unspecified: Secondary | ICD-10-CM | POA: Diagnosis not present

## 2015-06-21 DIAGNOSIS — R109 Unspecified abdominal pain: Secondary | ICD-10-CM | POA: Diagnosis present

## 2015-06-21 DIAGNOSIS — O26899 Other specified pregnancy related conditions, unspecified trimester: Secondary | ICD-10-CM

## 2015-06-21 LAB — URINALYSIS, ROUTINE W REFLEX MICROSCOPIC
Bilirubin Urine: NEGATIVE
GLUCOSE, UA: NEGATIVE mg/dL
HGB URINE DIPSTICK: NEGATIVE
Ketones, ur: 15 mg/dL — AB
Leukocytes, UA: NEGATIVE
Nitrite: NEGATIVE
Protein, ur: NEGATIVE mg/dL
Specific Gravity, Urine: 1.02 (ref 1.005–1.030)
pH: 7 (ref 5.0–8.0)

## 2015-06-21 NOTE — MAU Note (Signed)
Pt states she had an accident at work yesterday where her belly was struck with a cart. Pt states she had no bleeding or cramping at the time. Pt states that this morning after intercourse she started having right sided cramping. Pt states she hasn't felt the baby move last night or today. Pt denies LOF or vaginal bleeding.

## 2015-06-21 NOTE — MAU Provider Note (Signed)
History     CSN: [id number]  Arrival date and time: 06/21/15 1237   First Provider Initiated Contact with Patient 06/21/15 1315      Chief Complaint  Patient presents with  . Abdominal Cramping   HPI Comments: Kristina Yu is a 35 y.o. G3P2002 at [redacted]w[redacted]d presenting with lower right abdominal cramping and decreased fetal movement earlier today. Describes having had a soft strike to the mid abdomen yesterday at work when she was lifting a tray. That area is not painful. The cramping began during intercourse later that day and was present with moving around this morning. Last felt mild cramping when getting into car to come here. Good FM over the  past hour.    Abdominal Cramping This is a new problem. The current episode started yesterday. The onset quality is gradual. The problem occurs intermittently. The problem has been gradually improving. The pain is located in the RLQ (groin). The pain is at a severity of 3/10. The pain is mild. The quality of the pain is aching and cramping. The abdominal pain radiates to the RLQ. Pertinent negatives include no constipation, diarrhea, dysuria, fever, frequency, myalgias, nausea or vomiting. The pain is aggravated by movement. The pain is relieved by recumbency. She has tried nothing for the symptoms.    OB History  Gravida Para Term Preterm AB SAB TAB Ectopic Multiple Living  # Outcome Date GA Lbr Len/2nd Weight Sex Delivery Anes PTL Lv  3 Current           2 Term           1 Term                Past Medical History  Diagnosis Date  . Medical history non-contributory     Past Surgical History  Procedure Laterality Date  . Tooth extraction      History reviewed. No pertinent family history.  Social History  Substance Use Topics  . Smoking status: Current Every Day Smoker  . Smokeless tobacco: None  . Alcohol Use: No    Allergies:  Allergies  Allergen Reactions  . Amoxicillin Hives, Itching and Swelling     Has patient had a PCN reaction causing immediate rash, facial/tongue/throat swelling, SOB or lightheadedness with hypotension: Yes Has patient had a PCN reaction causing severe rash involving mucus membranes or skin necrosis: No Has patient had a PCN reaction that required hospitalization No Has patient had a PCN reaction occurring within the last 10 years: Yes If all of the above answers are "NO", then may proceed with Cephalosporin use.     Prescriptions prior to admission  Medication Sig Dispense Refill Last Dose  . IRON PO Take 1 tablet by mouth daily.   03/26/2015 at Unknown time  . metroNIDAZOLE (METROGEL) 0.75 % vaginal gel Place 1 Applicatorful vaginally at bedtime.   03/26/2015 at Unknown time  . Prenatal Vit-Fe Fumarate-FA (PRENATAL MULTIVITAMIN) TABS tablet Take 1 tablet by mouth daily at 12 noon.   03/26/2015 at Unknown time    Review of Systems  Constitutional: Negative for fever.  Gastrointestinal: Negative for nausea, vomiting, abdominal pain, diarrhea and constipation.       No cramps since in MAU  Genitourinary: Negative for dysuria, urgency, frequency and flank pain.  Musculoskeletal: Negative for myalgias.   Physical Exam   Blood pressure 120/66, pulse 78, temperature 97.9 F (36.6 C), temperature source Oral, resp.  rate 20, height 5\' 5"  (1.651 m), weight 103.964 kg (229 lb 3.2 oz), last menstrual period 01/12/2015.   EFM: FHR: Baseline 135-140, moderate variability, 10 bpm accelerations; audible FM which patient is perceiving Toco: no uterine contractions  Physical Exam  Nursing note and vitals reviewed. Constitutional: She is oriented to person, place, and time. She appears well-developed and well-nourished. No distress.  Eyes: Pupils are equal, round, and reactive to light.  Neck: Normal range of motion.  Cardiovascular: Normal rate.   Respiratory: Effort normal.  GI: Soft. There is no tenderness.  Genitourinary:  SVE: multiparous cx posterior, long, int  os ftp to closed  Neurological: She is alert and oriented to person, place, and time.  Skin: Skin is warm and dry.    MAU Course  Procedures Results for orders placed or performed during the hospital encounter of 06/21/15 (from the past 24 hour(s))  Urinalysis, Routine w reflex microscopic (not [facility])     Status: Abnormal   Collection Time: 06/21/15 12:51 PM  Result Value Ref Range   Color, Urine YELLOW YELLOW   APPearance CLEAR CLEAR   Specific Gravity, Urine 1.020 1.005 - 1.030   pH 7.0 5.0 - 8.0   Glucose, UA NEGATIVE NEGATIVE mg/dL   Hgb urine dipstick NEGATIVE NEGATIVE   Bilirubin Urine NEGATIVE NEGATIVE   Ketones, ur 15 (A) NEGATIVE mg/dL   Protein, ur NEGATIVE NEGATIVE mg/dL   Nitrite NEGATIVE NEGATIVE   Leukocytes, UA NEGATIVE NEGATIVE   MDM C/W Dr. Senaida Oresichardson  Assessment and Plan  Pain of round ligament affecting pregnancy, antepartum - Plan: Discharge patient  Decreased fetal movement, second trimester, not applicable or unspecified fetus - Plan: Discharge patient G3P2002 at 3260w2d Fetal well-being by EFM and maternal perception of FM Advised pelvic rest until cramping subsides    Medication List    TAKE these medications        metroNIDAZOLE 500 MG tablet  Commonly known as:  FLAGYL  Take 500 mg by mouth 2 (two) times daily.     prenatal multivitamin Tabs tablet  Take 1 tablet by mouth daily at 12 noon.       Follow-up Information    Follow up with Oliver PilaICHARDSON,KATHY W, MD In 1 month.   Specialty:  Obstetrics and Gynecology   Why:  Keep your scheduled prenatal appointment   Contact information:   [address] [place] [address] [phone]        Kristina Yu 06/21/2015, 1:24 PM

## 2015-06-21 NOTE — Discharge Instructions (Signed)
Pelvic Rest °Pelvic rest is sometimes recommended for women when:  °· The placenta is partially or completely covering the opening of the cervix (placenta previa). °· There is bleeding between the uterine wall and the amniotic sac in the first trimester (subchorionic hemorrhage). °· The cervix begins to open without labor starting (incompetent cervix, cervical insufficiency). °· The labor is too early (preterm labor). °HOME CARE INSTRUCTIONS °· Do not have sexual intercourse, stimulation, or an orgasm. °· Do not use tampons, douche, or put anything in the vagina. °· Do not lift anything over 10 pounds (4.5 kg). °· Avoid strenuous activity or straining your pelvic muscles. °SEEK MEDICAL CARE IF:  °· You have any vaginal bleeding during pregnancy. Treat this as a potential emergency. °· You have cramping pain felt low in the stomach (stronger than menstrual cramps). °· You notice vaginal discharge (watery, mucus, or bloody). °· You have a low, dull backache. °· There are regular contractions or uterine tightening. °SEEK IMMEDIATE MEDICAL CARE IF: °You have vaginal bleeding and have placenta previa.  °  °This information is not intended to replace advice given to you by your health care provider. Make sure you discuss any questions you have with your health care provider. °  °Document Released: 05/21/2010 Document Revised: 04/18/2011 Document Reviewed: 07/28/2014 °Elsevier Interactive Patient Education ©2016 [employer] Inc. ° °

## 2015-09-15 LAB — OB RESULTS CONSOLE GBS: GBS: NEGATIVE

## 2015-10-01 ENCOUNTER — Telehealth (HOSPITAL_COMMUNITY): Payer: Self-pay | Admitting: *Deleted

## 2015-10-01 ENCOUNTER — Encounter (HOSPITAL_COMMUNITY): Payer: Self-pay | Admitting: *Deleted

## 2015-10-01 NOTE — Telephone Encounter (Signed)
Preadmission screen  

## 2015-10-03 NOTE — H&P (Signed)
Kristina Yu is a 35 y.o. female G3P2002 at 39+ weeks (EDD 10/09/15 by LMP c/w 9 week US) presenting for IOL at term.  Prenatal care significant for AMA with normal panorama.  No other significant issues.  OB History    Gravida Para Term Preterm AB Living   3 2 2     2    SAB TAB Ectopic Multiple Live Births           2    NSVD 2004 7# NSVD 2005 7#  Past Medical History:  Diagnosis Date  . 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 Hx of varicella   . Medical history non-contributory    Past Surgical History:  Procedure Laterality Date  . TOOTH EXTRACTION     Family History: family history is not on file. Social History:  reports that she quit smoking about 8 months ago. She has never used smokeless tobacco. She reports that she does not drink alcohol or use drugs.     Maternal Diabetes: No Genetic Screening: Normal Maternal Ultrasounds/Referrals: Normal Fetal Ultrasounds or other Referrals:  None Maternal Substance Abuse:  No, quit smoking with pregnancy Significant Maternal Medications:  None Significant Maternal Lab Results:  None Other Comments:  None  Review of Systems  Cardiovascular: Negative for chest pain.   Maternal Medical History:  Contractions: Frequency: irregular.   Perceived severity is mild.    Fetal activity: Perceived fetal activity is normal.    Prenatal complications: no prenatal complications Prenatal Complications - Diabetes: none.      Last menstrual period 01/12/2015. Maternal Exam:  Uterine Assessment: Contraction strength is mild.  Contraction frequency is irregular.   Abdomen: Patient reports no abdominal tenderness. Fetal presentation: vertex  Introitus: Normal vulva. Normal vagina.    Physical Exam  Constitutional: She appears well-developed and well-nourished.  Cardiovascular: Normal rate and regular rhythm.    Respiratory: Effort normal.  GI: Soft.  Genitourinary: Vagina normal and uterus normal.  Neurological: She is alert.    Prenatal labs: ABO, Rh: O/Positive/-- (02/01 0000) Antibody: Negative (02/01 0000) Rubella: Immune (02/01 0000) RPR: Nonreactive (02/01 0000)  HBsAg: Negative (02/01 0000)  HIV: Non-reactive (02/01 0000)  GBS: Negative (08/08 0000)  One hour GCT 102 Panorama negative  Assessment/Plan: Pt for IOL at term.  Plan pitocin and AROM with epidural prn.    Oliver PilaRICHARDSON,Bryan Omura W 10/03/2015, 9:50 PM

## 2015-10-05 ENCOUNTER — Inpatient Hospital Stay (HOSPITAL_COMMUNITY)
Admission: RE | Admit: 2015-10-05 | Discharge: 2015-10-07 | DRG: 775 | Disposition: A | Discharge status: Home / Self Care | Payer: BLUE CROSS/BLUE SHIELD | Source: Ambulatory Visit | Attending: Obstetrics and Gynecology | Admitting: Obstetrics and Gynecology

## 2015-10-05 ENCOUNTER — Encounter (HOSPITAL_COMMUNITY): Payer: Self-pay

## 2015-10-05 ENCOUNTER — Inpatient Hospital Stay (HOSPITAL_COMMUNITY): Payer: BLUE CROSS/BLUE SHIELD | Admitting: Anesthesiology

## 2015-10-05 DIAGNOSIS — Z3490 Encounter for supervision of normal pregnancy, unspecified, unspecified trimester: Secondary | ICD-10-CM

## 2015-10-05 DIAGNOSIS — Z3A39 39 weeks gestation of pregnancy: Secondary | ICD-10-CM | POA: Diagnosis not present

## 2015-10-05 DIAGNOSIS — Z87891 Personal history of nicotine dependence: Secondary | ICD-10-CM | POA: Diagnosis not present

## 2015-10-05 DIAGNOSIS — Z3483 Encounter for supervision of other normal pregnancy, third trimester: Secondary | ICD-10-CM | POA: Diagnosis present

## 2015-10-05 LAB — CBC
HCT: 27.4 % — ABNORMAL LOW (ref 36.0–46.0)
Hemoglobin: 8.8 g/dL — ABNORMAL LOW (ref 12.0–15.0)
MCH: 23.1 pg — AB (ref 26.0–34.0)
MCHC: 32.1 g/dL (ref 30.0–36.0)
MCV: 71.9 fL — AB (ref 78.0–100.0)
PLATELETS: 235 10*3/uL (ref 150–400)
RBC: 3.81 MIL/uL — AB (ref 3.87–5.11)
RDW: 15.5 % (ref 11.5–15.5)
WBC: 6.3 10*3/uL (ref 4.0–10.5)

## 2015-10-05 LAB — TYPE AND SCREEN
ABO/RH(D): O POS
Antibody Screen: NEGATIVE

## 2015-10-05 LAB — ABO/RH: ABO/RH(D): O POS

## 2015-10-05 MED ORDER — ACETAMINOPHEN 325 MG PO TABS: 650.0000 mg | ORAL_TABLET | ORAL | Status: DC | PRN

## 2015-10-05 MED ORDER — LIDOCAINE HCL (PF) 1 % IJ SOLN
30.0000 mL | INTRAMUSCULAR | Status: DC | PRN
  Filled 2015-10-05: qty 30

## 2015-10-05 MED ORDER — FENTANYL 2.5 MCG/ML BUPIVACAINE 1/10 % EPIDURAL INFUSION (WH - ANES)
INTRAMUSCULAR | Status: AC
  Administered 2015-10-05: 13:00:00
  Filled 2015-10-05: qty 125

## 2015-10-05 MED ORDER — LACTATED RINGERS IV SOLN
500.0000 mL | INTRAVENOUS | Status: DC | PRN
  Administered 2015-10-05: 500 mL via INTRAVENOUS

## 2015-10-05 MED ORDER — FENTANYL CITRATE (PF) 100 MCG/2ML IJ SOLN: 50.0000 ug | INTRAMUSCULAR | Status: DC | PRN

## 2015-10-05 MED ORDER — FENTANYL 2.5 MCG/ML BUPIVACAINE 1/10 % EPIDURAL INFUSION (WH - ANES)
INTRAMUSCULAR | Status: DC | PRN
  Administered 2015-10-05: 21:00:00
  Administered 2015-10-05: 14 mL/h via EPIDURAL

## 2015-10-05 MED ORDER — OXYTOCIN BOLUS FROM INFUSION
500.0000 mL | Freq: Once | INTRAVENOUS | Status: AC
Start: 2015-10-05 — End: 2015-10-06
  Administered 2015-10-06: 500 mL via INTRAVENOUS

## 2015-10-05 MED ORDER — OXYCODONE-ACETAMINOPHEN 5-325 MG PO TABS: 1.0000 | ORAL_TABLET | ORAL | Status: DC | PRN

## 2015-10-05 MED ORDER — LACTATED RINGERS IV SOLN
INTRAVENOUS | Status: DC
  Administered 2015-10-05: 1000 mL via INTRAVENOUS

## 2015-10-05 MED ORDER — TERBUTALINE SULFATE 1 MG/ML IJ SOLN
0.2500 mg | Freq: Once | INTRAMUSCULAR | Status: DC | PRN
  Filled 2015-10-05: qty 1

## 2015-10-05 MED ORDER — OXYTOCIN 40 UNITS IN LACTATED RINGERS INFUSION - SIMPLE MED
1.0000 m[IU]/min | INTRAVENOUS | Status: DC
  Administered 2015-10-05: 12 m[IU]/min via INTRAVENOUS
  Administered 2015-10-05: 10 m[IU]/min via INTRAVENOUS

## 2015-10-05 MED ORDER — OXYTOCIN 40 UNITS IN LACTATED RINGERS INFUSION - SIMPLE MED
1.0000 m[IU]/min | INTRAVENOUS | Status: DC
  Administered 2015-10-05: 2 m[IU]/min via INTRAVENOUS
  Filled 2015-10-05: qty 1000

## 2015-10-05 MED ORDER — LACTATED RINGERS IV SOLN: 500.0000 mL | Freq: Once | INTRAVENOUS | Status: DC

## 2015-10-05 MED ORDER — OXYCODONE-ACETAMINOPHEN 5-325 MG PO TABS: 2.0000 | ORAL_TABLET | ORAL | Status: DC | PRN

## 2015-10-05 MED ORDER — PHENYLEPHRINE 40 MCG/ML (10ML) SYRINGE FOR IV PUSH (FOR BLOOD PRESSURE SUPPORT)
80.0000 ug | PREFILLED_SYRINGE | INTRAVENOUS | Status: DC | PRN
  Filled 2015-10-05: qty 5

## 2015-10-05 MED ORDER — EPHEDRINE 5 MG/ML INJ
10.0000 mg | INTRAVENOUS | Status: DC | PRN
  Filled 2015-10-05: qty 4

## 2015-10-05 MED ORDER — FENTANYL 2.5 MCG/ML BUPIVACAINE 1/10 % EPIDURAL INFUSION (WH - ANES)
INTRAMUSCULAR | Status: AC
  Filled 2015-10-05: qty 125

## 2015-10-05 MED ORDER — SOD CITRATE-CITRIC ACID 500-334 MG/5ML PO SOLN: 30.0000 mL | ORAL | Status: DC | PRN

## 2015-10-05 MED ORDER — OXYTOCIN 40 UNITS IN LACTATED RINGERS INFUSION - SIMPLE MED
2.5000 [IU]/h | INTRAVENOUS | Status: DC
  Filled 2015-10-05: qty 1000

## 2015-10-05 MED ORDER — PHENYLEPHRINE 40 MCG/ML (10ML) SYRINGE FOR IV PUSH (FOR BLOOD PRESSURE SUPPORT)
PREFILLED_SYRINGE | INTRAVENOUS | Status: AC
  Filled 2015-10-05: qty 20

## 2015-10-05 MED ORDER — ONDANSETRON HCL 4 MG/2ML IJ SOLN
4.0000 mg | Freq: Four times a day (QID) | INTRAMUSCULAR | Status: DC | PRN
  Filled 2015-10-05: qty 2

## 2015-10-05 MED ORDER — LIDOCAINE HCL (PF) 1 % IJ SOLN
INTRAMUSCULAR | Status: DC | PRN
  Administered 2015-10-05 (×2): 4 mL via EPIDURAL

## 2015-10-05 NOTE — Progress Notes (Signed)
This note also relates to the following rows which could not be included: BP - Cannot attach notes to unvalidated device data Pulse Rate - Cannot attach notes to unvalidated device data  Switched from telemetry monitor to corded.

## 2015-10-05 NOTE — Anesthesia Pain Management Evaluation Note (Signed)
  CRNA Pain Management Visit Note  Patient: Kristina Yu, 35 y.o., female  "Hello I am a [facility] of the anesthesia team [facility]. We have an anesthesia team available at all times to provide care throughout the hospital, including epidural management and anesthesia for C-section. I don't know your plan for the delivery whether it a natural birth, water birth, IV sedation, nitrous supplementation, doula or epidural, but we want to meet your pain goals."   1.Was your pain managed to your expectations on prior hospitalizations?   Yes   2.What is your expectation for pain management during this hospitalization?     Epidural  3.How can we help you reach that goal? epidural  Record the patient's initial score and the patient's pain goal.   Pain: 0  Pain Goal: 4 [facility] wants you to be able to say your pain was always managed very well.  Madison HickmanGREGORY,Asad Keeven 10/05/2015

## 2015-10-05 NOTE — Progress Notes (Signed)
IUPc replaced by Dr Senaida Oresichardson

## 2015-10-05 NOTE — Anesthesia Preprocedure Evaluation (Signed)
Anesthesia Evaluation  Patient identified by MRN, date of birth, ID band Patient awake    Reviewed: Allergy & Precautions, NPO status , Patient's Chart, lab work & pertinent test results  Airway Mallampati: II  TM Distance: >3 FB Neck ROM: Full    Dental  (+) Teeth Intact, Dental Advisory Given   Pulmonary former smoker,    breath sounds clear to auscultation       Cardiovascular negative cardio ROS   Rhythm:Regular Rate:Normal     Neuro/Psych negative neurological ROS  negative psych ROS   GI/Hepatic negative GI ROS, Neg liver ROS,   Endo/Other  negative endocrine ROS  Renal/GU negative Renal ROS  negative genitourinary   Musculoskeletal negative musculoskeletal ROS (+)   Abdominal   Peds negative pediatric ROS (+)  Hematology negative hematology ROS (+)   Anesthesia Other Findings   Reproductive/Obstetrics (+) Pregnancy                             Lab Results  Component Value Date   WBC 6.3 10/05/2015   HGB 8.8 (L) 10/05/2015   HCT 27.4 (L) 10/05/2015   MCV 71.9 (L) 10/05/2015   PLT 235 10/05/2015   No results found for: INR, PROTIME   Anesthesia Physical Anesthesia Plan  ASA: II  Anesthesia Plan: Epidural   Post-op Pain Management:    Induction:   Airway Management Planned:   Additional Equipment:   Intra-op Plan:   Post-operative Plan:   Informed Consent: I have reviewed the patients History and Physical, chart, labs and discussed the procedure including the risks, benefits and alternatives for the proposed anesthesia with the patient or authorized representative who has indicated his/her understanding and acceptance.     Plan Discussed with:   Anesthesia Plan Comments:         Anesthesia Quick Evaluation

## 2015-10-05 NOTE — Progress Notes (Signed)
Patient ID: Kristina Yu, female   DOB: Apr 08, 1980, 35 y.o.   MRN: [id number] Pt comfortable with epidural  afeb vss Cervix 80/6/-1  Having difficulty getting contractions adequate even with pitocin at 20mu.  Will try to go up to 30mu and see if can achieve contractions.    If does not work may turn of and let uterus rest then restart.

## 2015-10-05 NOTE — Progress Notes (Signed)
Patient ID: Kristina Yu, female   DOB: 1980-07-11, 35 y.o.   MRN: [id number] Pt IUPC was not reading well so replaced  Cervix 60/6/-1  Placed in exaggerated Sims Pitocin stopped and restarted at 10mu

## 2015-10-05 NOTE — Progress Notes (Signed)
This note also relates to the following rows which could not be included: BP - Cannot attach notes to unvalidated device data Pulse Rate - Cannot attach notes to unvalidated device data  Pt feels "light headed"

## 2015-10-05 NOTE — Progress Notes (Signed)
Patient ID: Kristina Yu, female   DOB: 1980-05-12, 35 y.o.   MRN: [id number] Pt here on Pitocin, feeling minimal contractions  Afeb VSS FHR category 1  Cervix 50/3-4/-2  AROM clear Follow progress Epidural prn

## 2015-10-05 NOTE — Progress Notes (Signed)
This note also relates to the following rows which could not be included: BP - Cannot attach notes to unvalidated device data Pulse Rate - Cannot attach notes to unvalidated device data  MD orders pitocin off til 1930 then restart at 10mu

## 2015-10-05 NOTE — Anesthesia Procedure Notes (Signed)
Epidural Patient location during procedure: OB Start time: 10/05/2015 12:58 PM End time: 10/05/2015 1:08 PM  Staffing Anesthesiologist: Shona SimpsonHOLLIS, Vedanth Sirico D Performed: anesthesiologist   Preanesthetic Checklist Completed: patient identified, site marked, surgical consent, pre-op evaluation, timeout performed, IV checked, risks and benefits discussed and monitors and equipment checked  Epidural Patient position: sitting Prep: ChloraPrep Patient monitoring: heart rate, continuous pulse ox and blood pressure Approach: midline Location: L3-L4 Injection technique: LOR saline  Needle:  Needle type: Tuohy  Needle gauge: 17 G Needle length: 9 cm Catheter type: closed end flexible Catheter size: 20 Guage Test dose: negative and 1.5% lidocaine  Assessment Events: blood not aspirated, injection not painful, no injection resistance and no paresthesia  Additional Notes LOR @ 8  Two attempts at same level because patient could not tolerate pressure of placement and moved.   Patient identified. Risks/Benefits/Options discussed with patient including but not limited to bleeding, infection, nerve damage, paralysis, failed block, incomplete pain control, headache, blood pressure changes, nausea, vomiting, reactions to medications, itching and postpartum back pain. Confirmed with bedside nurse the patient's most recent platelet count. Confirmed with patient that they are not currently taking any anticoagulation, have any bleeding history or any family history of bleeding disorders. Patient expressed understanding and wished to proceed. All questions were answered. Sterile technique was used throughout the entire procedure. Please see nursing notes for vital signs. Test dose was given through epidural catheter and negative prior to continuing to dose epidural or start infusion. Warning signs of high block given to the patient including shortness of breath, tingling/numbness in hands, complete motor block,  or any concerning symptoms with instructions to call for help. Patient was given instructions on fall risk and not to get out of bed. All questions and concerns addressed with instructions to call with any issues or inadequate analgesia.    Reason for block:procedure for pain

## 2015-10-05 NOTE — Progress Notes (Signed)
Patient ID: Kristina Yu, female   DOB: 1980-10-26, 35 y.o.   MRN: [id number] Pt comfortable with epidural  afeb vss Cervix 50/4/-2  FHR category 1  Will increase pitocin and place IUPC if no cervical change

## 2015-10-06 ENCOUNTER — Encounter (HOSPITAL_COMMUNITY): Payer: Self-pay

## 2015-10-06 LAB — RPR: RPR Ser Ql: NONREACTIVE

## 2015-10-06 MED ORDER — ONDANSETRON HCL 4 MG PO TABS: 4.0000 mg | ORAL_TABLET | ORAL | Status: DC | PRN

## 2015-10-06 MED ORDER — IBUPROFEN 600 MG PO TABS
600.0000 mg | ORAL_TABLET | Freq: Four times a day (QID) | ORAL | Status: DC
  Administered 2015-10-06 – 2015-10-07 (×6): 600 mg via ORAL
  Filled 2015-10-06 (×6): qty 1

## 2015-10-06 MED ORDER — TETANUS-DIPHTH-ACELL PERTUSSIS 5-2.5-18.5 LF-MCG/0.5 IM SUSP: 0.5000 mL | Freq: Once | INTRAMUSCULAR | Status: DC

## 2015-10-06 MED ORDER — METHYLERGONOVINE MALEATE 0.2 MG/ML IJ SOLN
INTRAMUSCULAR | Status: AC
  Filled 2015-10-06: qty 1

## 2015-10-06 MED ORDER — WITCH HAZEL-GLYCERIN EX PADS: 1.0000 "application " | MEDICATED_PAD | CUTANEOUS | Status: DC | PRN

## 2015-10-06 MED ORDER — ONDANSETRON HCL 4 MG/2ML IJ SOLN: 4.0000 mg | INTRAMUSCULAR | Status: DC | PRN

## 2015-10-06 MED ORDER — MISOPROSTOL 200 MCG PO TABS
ORAL_TABLET | ORAL | Status: AC
  Filled 2015-10-06: qty 4

## 2015-10-06 MED ORDER — OXYCODONE HCL 5 MG PO TABS: 5.0000 mg | ORAL_TABLET | ORAL | Status: DC | PRN

## 2015-10-06 MED ORDER — DIBUCAINE 1 % RE OINT: 1.0000 "application " | TOPICAL_OINTMENT | RECTAL | Status: DC | PRN

## 2015-10-06 MED ORDER — COCONUT OIL OIL
1.0000 "application " | TOPICAL_OIL | Status: DC | PRN
  Administered 2015-10-07: 1 via TOPICAL
  Filled 2015-10-06: qty 120

## 2015-10-06 MED ORDER — PRENATAL MULTIVITAMIN CH
1.0000 | ORAL_TABLET | Freq: Every day | ORAL | Status: DC
  Administered 2015-10-06 – 2015-10-07 (×2): 1 via ORAL
  Filled 2015-10-06 (×2): qty 1

## 2015-10-06 MED ORDER — ACETAMINOPHEN 325 MG PO TABS: 650.0000 mg | ORAL_TABLET | ORAL | Status: DC | PRN

## 2015-10-06 MED ORDER — ZOLPIDEM TARTRATE 5 MG PO TABS: 5.0000 mg | ORAL_TABLET | Freq: Every evening | ORAL | Status: DC | PRN

## 2015-10-06 MED ORDER — OXYCODONE HCL 5 MG PO TABS: 10.0000 mg | ORAL_TABLET | ORAL | Status: DC | PRN

## 2015-10-06 MED ORDER — SIMETHICONE 80 MG PO CHEW: 80.0000 mg | CHEWABLE_TABLET | ORAL | Status: DC | PRN

## 2015-10-06 MED ORDER — DIPHENHYDRAMINE HCL 25 MG PO CAPS: 25.0000 mg | ORAL_CAPSULE | Freq: Four times a day (QID) | ORAL | Status: DC | PRN

## 2015-10-06 MED ORDER — SENNOSIDES-DOCUSATE SODIUM 8.6-50 MG PO TABS
2.0000 | ORAL_TABLET | ORAL | Status: DC
  Administered 2015-10-06: 2 via ORAL
  Filled 2015-10-06: qty 2

## 2015-10-06 MED ORDER — BENZOCAINE-MENTHOL 20-0.5 % EX AERO: 1.0000 "application " | INHALATION_SPRAY | CUTANEOUS | Status: DC | PRN

## 2015-10-06 NOTE — Progress Notes (Signed)
Post Partum Day 0 Subjective: no complaints, up ad lib, tolerating PO and nl lochia, pain controlled  Objective: Blood pressure 118/68, pulse 65, temperature 98.3 F (36.8 C), temperature source Oral, resp. rate 20, height 5\' 7"  (1.702 m), weight 103.4 kg (228 lb), last menstrual period 01/12/2015, SpO2 100 %, unknown if currently breastfeeding.  Physical Exam:  General: alert and no distress Lochia: appropriate Uterine Fundus: firm   Recent Labs  10/05/15 0725  HGB 8.8*  HCT 27.4*    Assessment/Plan: Breastfeeding and Lactation consult.  Routine care.     LOS: 1 day   Yu, Kristina Heyne 10/06/2015, 8:20 AM

## 2015-10-06 NOTE — Lactation Note (Addendum)
This note was copied from a baby's chart. Lactation Consultation Note: This is mothers first child to breastfeed. Mother states that her infant latched on very well at delivery. Mother states that she wants to do bottle and breast. She states that her youngest child has asthma and she knows that breastfeeding prevents asthma.  Basic breastfeeding teaching done. Mother taught hand expression, no observed colostrum at this time. Mother advised to massage breast and then continue to try and hand express. Mother receptive to all teaching. Mother to page to have nurse or LC check latch.  Patient Name: Kristina Yu BJYNW'GToday's Date: 10/06/2015     Maternal Data    Feeding Feeding Type: Breast Fed  LATCH Score/Interventions                      Lactation Tools Discussed/Used     Consult Status      Michel BickersKendrick, Britt Petroni McCoy 10/06/2015, 12:08 PM

## 2015-10-06 NOTE — Anesthesia Postprocedure Evaluation (Signed)
Anesthesia Post Note  Patient: Kristina Yu  Procedure(s) Performed: * No procedures listed *  Patient location during evaluation: Mother Baby Anesthesia Type: Epidural Level of consciousness: awake, awake and alert, oriented and patient cooperative Pain management: pain level controlled Vital Signs Assessment: post-procedure vital signs reviewed and stable Respiratory status: spontaneous breathing, nonlabored ventilation and respiratory function stable Cardiovascular status: stable Postop Assessment: no headache, no backache, no signs of nausea or vomiting and patient able to bend at knees Anesthetic complications: no     Last Vitals:  Vitals:   10/06/15 0600 10/06/15 0700  BP: 116/64 118/68  Pulse: 72 65  Resp: 16 20  Temp: 37.1 C 36.8 C    Last Pain:  Vitals:   10/06/15 1137  TempSrc:   PainSc: 0-No pain   Pain Goal:                 [name]

## 2015-10-07 LAB — CBC
HEMATOCRIT: 25.1 % — AB (ref 36.0–46.0)
HEMOGLOBIN: 8 g/dL — AB (ref 12.0–15.0)
MCH: 22.9 pg — ABNORMAL LOW (ref 26.0–34.0)
MCHC: 31.9 g/dL (ref 30.0–36.0)
MCV: 71.7 fL — AB (ref 78.0–100.0)
Platelets: 228 10*3/uL (ref 150–400)
RBC: 3.5 MIL/uL — ABNORMAL LOW (ref 3.87–5.11)
RDW: 15.4 % (ref 11.5–15.5)
WBC: 7.8 10*3/uL (ref 4.0–10.5)

## 2015-10-07 MED ORDER — IBUPROFEN 600 MG PO TABS: 600.0000 mg | ORAL_TABLET | Freq: Four times a day (QID) | ORAL | 0 refills | Status: AC

## 2015-10-07 NOTE — Plan of Care (Signed)
Problem: Health Behavior/Discharge Planning: Goal: Ability to manage health-related needs will improve Outcome: Completed/Met Date Met: 10/07/15 Discharge instructions and paperwork discussed.  Pt verbalizes understanding of reasons to call the MD following discharge.  Pt verbalizes understanding of breastfeeding resources and problems such as engorgement and mastitis. Pt denies questions at this time.

## 2015-10-07 NOTE — Progress Notes (Signed)
Post Partum Day 1 Subjective: no complaints and tolerating PO, would like d/c home  Objective: Blood pressure 122/69, pulse 65, temperature 97.9 F (36.6 C), temperature source Oral, resp. rate 18, height 5\' 7"  (1.702 m), weight 103.4 kg (228 lb), last menstrual period 01/12/2015, SpO2 100 %, unknown if currently breastfeeding.  Physical Exam:  General: alert and cooperative Lochia: appropriate Uterine Fundus: firm    Recent Labs  10/05/15 0725 10/07/15 0352  HGB 8.8* 8.0*  HCT 27.4* 25.1*    Assessment/Plan: Discharge home   LOS: 2 days   Koleson Reifsteck W 10/07/2015, 9:34 AM

## 2015-10-07 NOTE — Discharge Summary (Signed)
    OB Discharge Summary     Patient Name: Kristina Yu DOB: 08-19-1980 MRN: [id number]  Date of admission: 10/05/2015 Delivering MD: Huel CoteICHARDSON, Juergen Hardenbrook   Date of discharge: 10/07/2015  Admitting diagnosis: INDUCTION Intrauterine pregnancy: 626w4d     Secondary diagnosis:  Active Problems:   Term pregnancy, repeat   NSVD (normal spontaneous vaginal delivery)  Additional problems: none     Discharge diagnosis: Term Pregnancy Delivered                                                                                                Post partum procedures:none  Augmentation: AROM and Pitocin  Complications: None  Hospital course:  Induction of Labor With Vaginal Delivery   35 y.o. yo G3P3003 at 706w4d was admitted to the hospital 10/05/2015 for induction of labor.  Indication for induction: Favorable cervix at term.  Patient had an uncomplicated labor course as follows: Membrane Rupture Time/Date: 9:50 AM ,10/05/2015   Intrapartum Procedures: Episiotomy: None [1]                                         Lacerations:  None [1]  Patient had delivery of a Viable infant.  Information for the patient's newborn:  Clarene DukeLittle, Girl Hansel Starlingdrienne [[id number]][[id number]]  Delivery Method: Vag-Spont   10/06/2015  Details of delivery can be found in separate delivery note.  Patient had a routine postpartum course. Patient is discharged home 10/07/15.   Physical exam  Vitals:   10/06/15 0700 10/06/15 1700 10/06/15 1956 10/07/15 0622  BP: 118/68 117/70 126/70 122/69  Pulse: 65 73 79 65  Resp: 20 20 18 18   Temp: 98.3 F (36.8 C) 98.3 F (36.8 C) 98.7 F (37.1 C) 97.9 F (36.6 C)  TempSrc: Oral Oral Oral Oral  SpO2:      Weight:      Height:       General: alert and cooperative Lochia: appropriate Uterine Fundus: firm  Labs: Lab Results  Component Value Date   WBC 7.8 10/07/2015   HGB 8.0 (L) 10/07/2015   HCT 25.1 (L) 10/07/2015   MCV 71.7 (L) 10/07/2015   PLT 228 10/07/2015   No flowsheet data  found.  Discharge instruction: per After Visit Summary and "Baby and Me Booklet".  After visit meds:    Medication List    TAKE these medications   ibuprofen 600 MG tablet Commonly known as:  ADVIL,MOTRIN Take 1 tablet (600 mg total) by mouth every 6 (six) hours.       Diet: routine diet  Activity: Advance as tolerated. Pelvic rest for 6 weeks.   Outpatient follow up:6 weeks Follow up Appt:No future appointments. Follow up Visit:No Follow-up on file.  Postpartum contraception: Plans interval tubal ligation  Newborn Data: Live born female  Birth Weight: 8 lb 8 oz (3855 g) APGAR: 9, 9  Baby Feeding: Breast Disposition:home with mother   10/07/2015 Oliver PilaICHARDSON,Kaity Pitstick W, MD

## 2015-10-07 NOTE — Lactation Note (Signed)
This note was copied from a baby's chart. Lactation Consultation Note  Patient Name: [name] ZOXWR'UToday's Date: 10/07/2015 Reason for consult: Follow-up assessment Mom latching baby with minimal assist. Baby demonstrating good suckling bursts. Advised Mom baby should be at breast 8-12 times in 24 hours nursing 15-30 minutes both breasts when possible. Cluster feeding reviewed. Engorgement care reviewed if needed. Advised of OP services and support group. Hand pump demonstrated for home use if needed. Encouraged to call for questions/concerns.   Maternal Data    Feeding Feeding Type: Breast Fed  LATCH Score/Interventions Latch: Grasps breast easily, tongue down, lips flanged, rhythmical sucking. Intervention(s): Adjust position;Assist with latch;Breast massage;Breast compression  Audible Swallowing: A few with stimulation  Type of Nipple: Everted at rest and after stimulation  Comfort (Breast/Nipple): Filling, red/small blisters or bruises, mild/mod discomfort  Problem noted: Mild/Moderate discomfort Interventions (Mild/moderate discomfort): Hand massage;Hand expression (EBM/coconut oil prn)  Hold (Positioning): Assistance needed to correctly position infant at breast and maintain latch. Intervention(s): Breastfeeding basics reviewed;Support Pillows;Position options;Skin to skin  LATCH Score: 7  Lactation Tools Discussed/Used Tools: Pump Breast pump type: Manual   Consult Status Consult Status: Complete    Kristina Yu, Kristina Yu 10/07/2015, 10:42 AM

## 2015-10-07 NOTE — Lactation Note (Signed)
This note was copied from a baby's chart. Lactation Consultation Note  Patient Name: Kristina Yu YNWGN'FToday's Date: 10/07/2015 Reason for consult: Follow-up assessment;Breast/nipple pain Mom called for assist with latch on left breast. Left nipple has some cracking observed and is sore per Mom. Had Mom pre-pump to help with latch, changed flange to size 27. Assisted Mom to latch baby using football hold and breast compression. Mom reported pain with initial latch that was improving with baby nursing. Care for sore nipples reviewed, Advised to apply EBM/coconut oil. Comfort gels given with instructions to alternate with coconut oil. Encouraged to alternate positions each feeding. Do not use cradle hold till baby sustaining latch well and breast nipple pain resolved. If left nipple becomes too sore to latch baby, then advised to pump every 3 hours for 15 minutes and supplement baby with EBM/formula till her milk comes in. Continue BF on right breast. Call for questions/concerns.   Maternal Data    Feeding Feeding Type: Breast Fed Length of feed: 35 min  LATCH Score/Interventions Latch: Grasps breast easily, tongue down, lips flanged, rhythmical sucking. (left breast) Intervention(s): Assist with latch;Adjust position;Breast massage;Breast compression  Audible Swallowing: A few with stimulation  Type of Nipple: Everted at rest and after stimulation  Comfort (Breast/Nipple): Filling, red/small blisters or bruises, mild/mod discomfort  Problem noted: Cracked, bleeding, blisters, bruises;Mild/Moderate discomfort Interventions  (Cracked/bleeding/bruising/blister): Expressed breast milk to nipple (coconut oil, prn) Interventions (Mild/moderate discomfort): Pre-pump if needed  Hold (Positioning): Assistance needed to correctly position infant at breast and maintain latch. Intervention(s): Breastfeeding basics reviewed;Support Pillows;Position options;Skin to skin  LATCH Score:  7  Lactation Tools Discussed/Used Tools: Pump;Flanges;Other (comment) (coconut oil) Flange Size: 27 Breast pump type: Manual   Consult Status Consult Status: Complete    Alfred LevinsGranger, Anais Koenen Ann 10/07/2015, 11:09 AM

## 2015-10-08 LAB — CCBB MATERNAL DONOR DRAW

## 2015-11-20 ENCOUNTER — Encounter (HOSPITAL_BASED_OUTPATIENT_CLINIC_OR_DEPARTMENT_OTHER): Payer: Self-pay

## 2015-11-20 ENCOUNTER — Ambulatory Visit (HOSPITAL_BASED_OUTPATIENT_CLINIC_OR_DEPARTMENT_OTHER): Admit: 2015-11-20 | Payer: BLUE CROSS/BLUE SHIELD | Admitting: Obstetrics and Gynecology

## 2015-11-20 SURGERY — LIGATION, FALLOPIAN TUBE, LAPAROSCOPIC
Anesthesia: Choice | Laterality: Bilateral

## 2016-03-26 IMAGING — US US OB COMP LESS 14 WK
1 series · 15 of 28 positions shown · non-contrast
Comparison: None.

CLINICAL DATA: Positive pregnancy test, intrauterine device.

EXAM:
OBSTETRIC <14 WK US AND TRANSVAGINAL OB US
TECHNIQUE: Both transabdominal and transvaginal ultrasound examinations were
performed for complete evaluation of the gestation as well as the
maternal uterus, adnexal regions, and pelvic cul-de-sac.
Transvaginal technique was performed to assess early pregnancy.

[Series 1: us ob comp less 14 wk · 55 acquisitions, 15 frames shown]
[im 1/55]
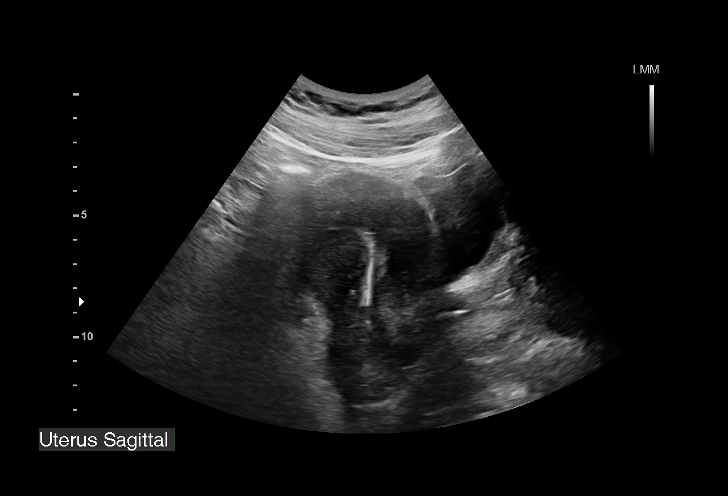
[im 5/55]
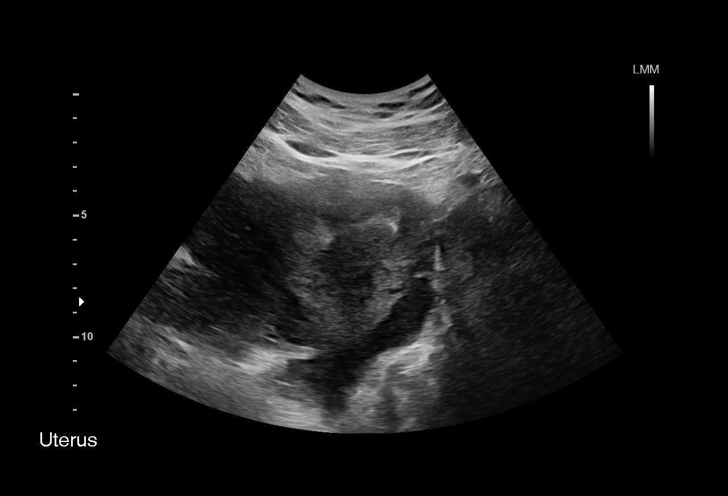
[im 9/55]
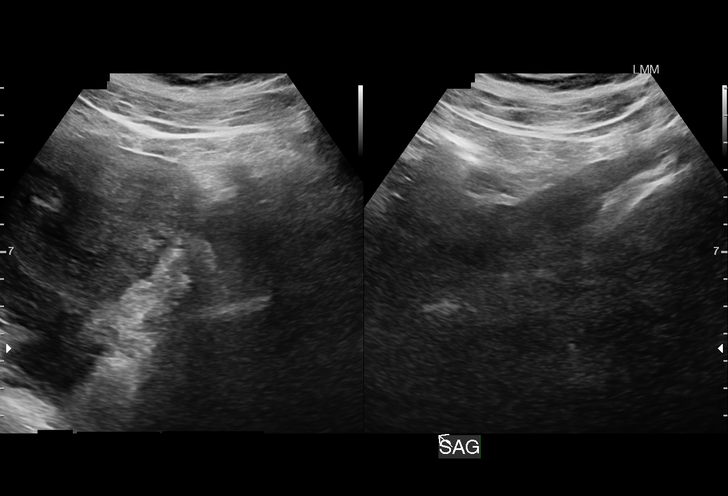
[im 13/55]
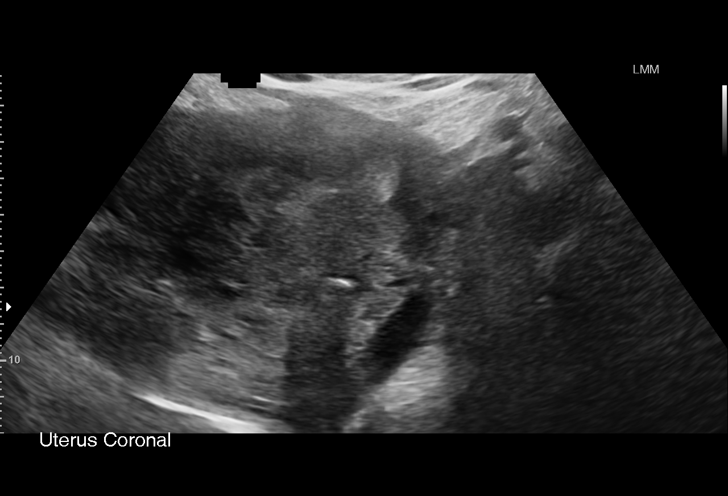
[im 17/55]
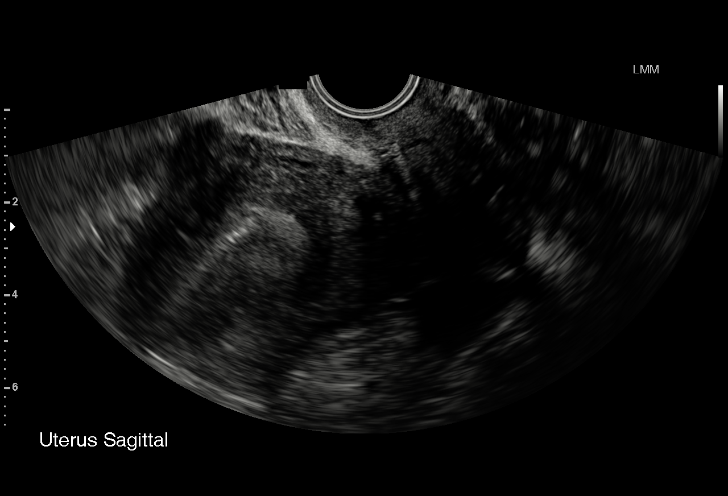
[im 21/55]
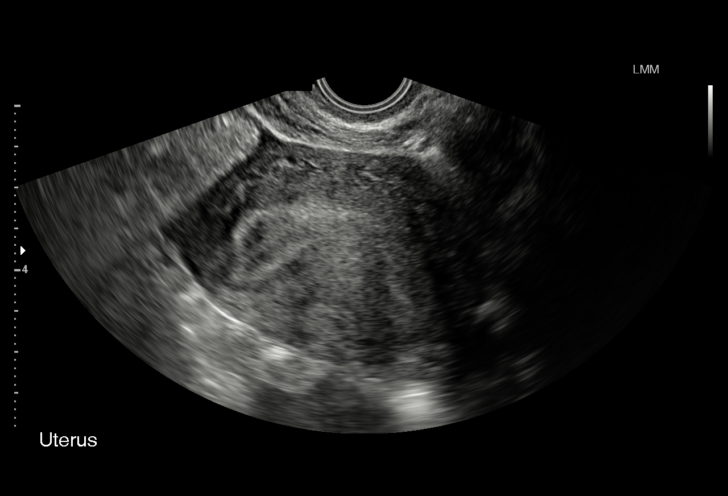
[im 25/55]
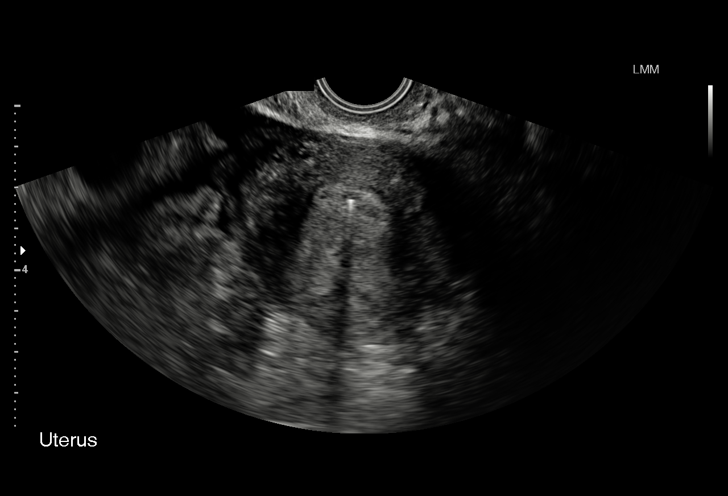
[im 29/55]
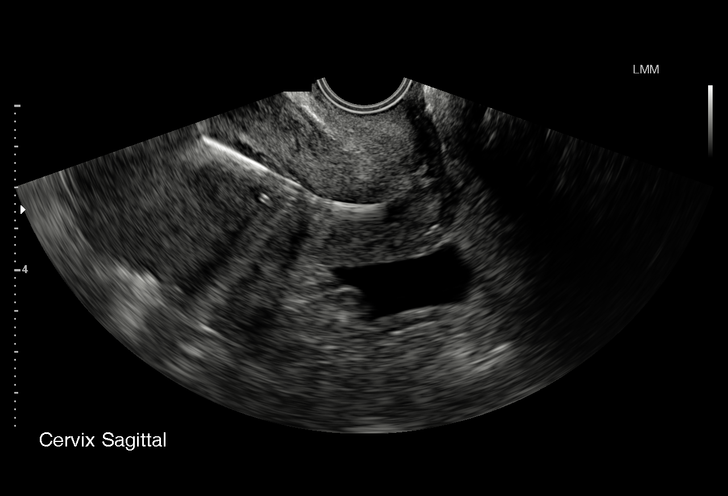
[im 31/55]
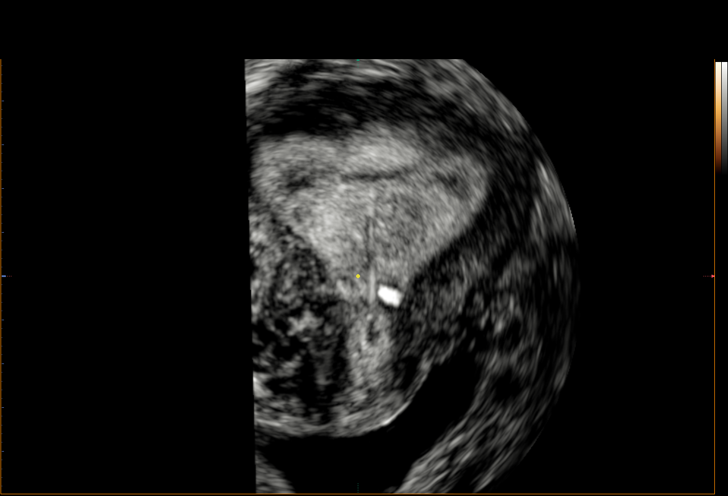
[im 35/55]
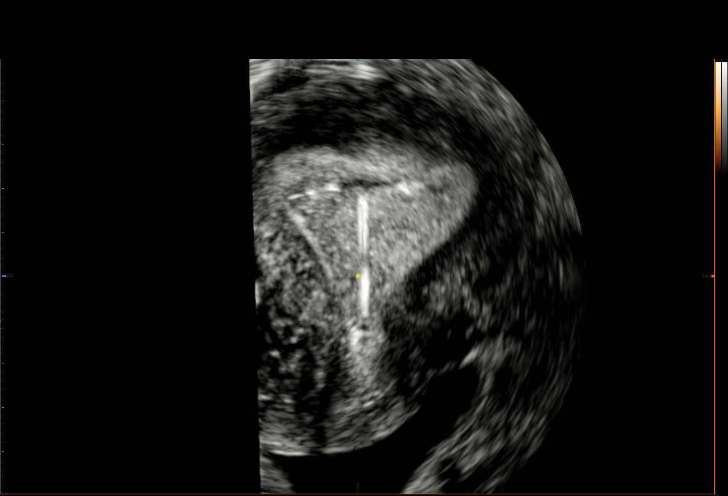
[im 39/55]
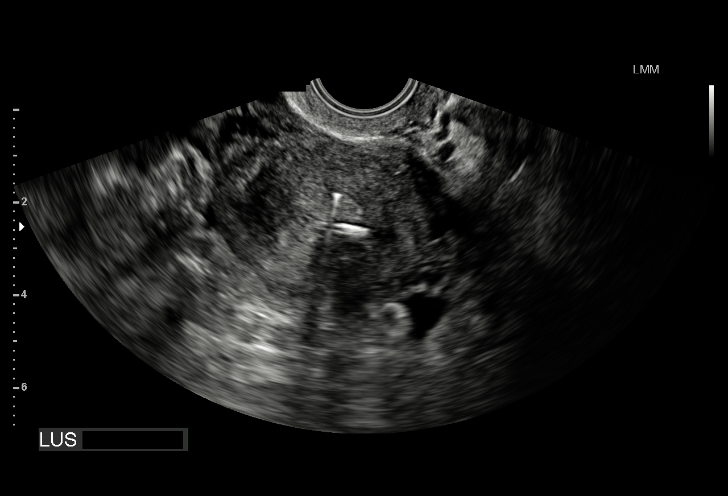
[im 43/55]
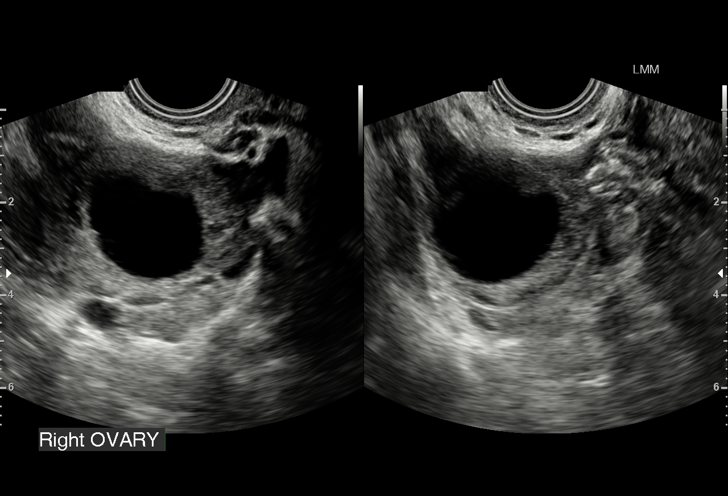
[im 47/55]
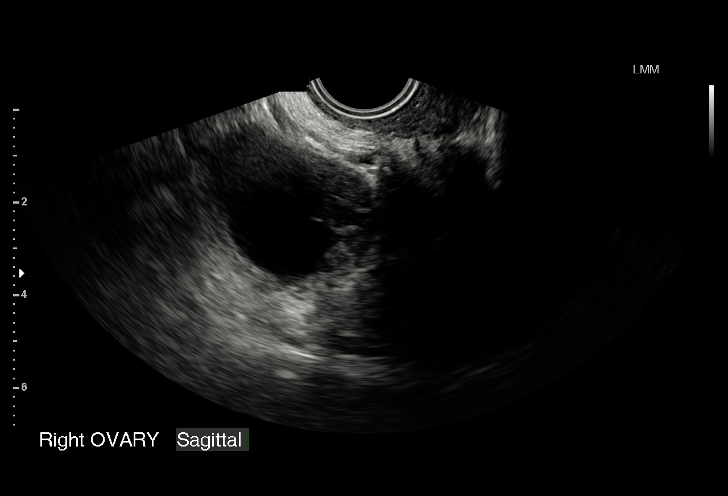
[im 51/55]
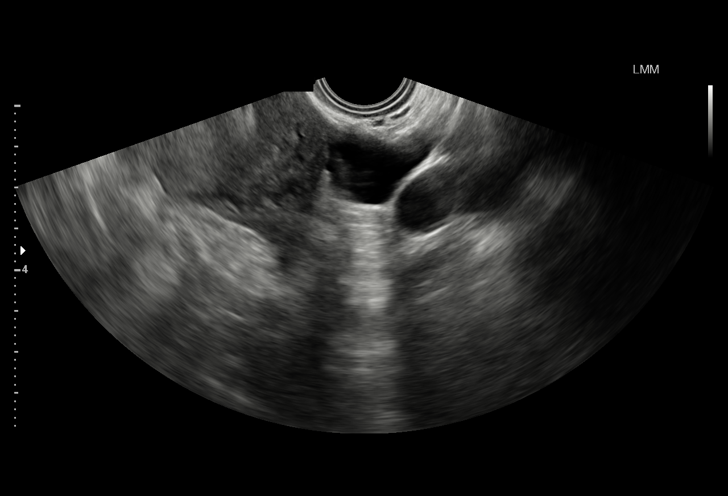
[im 55/55]
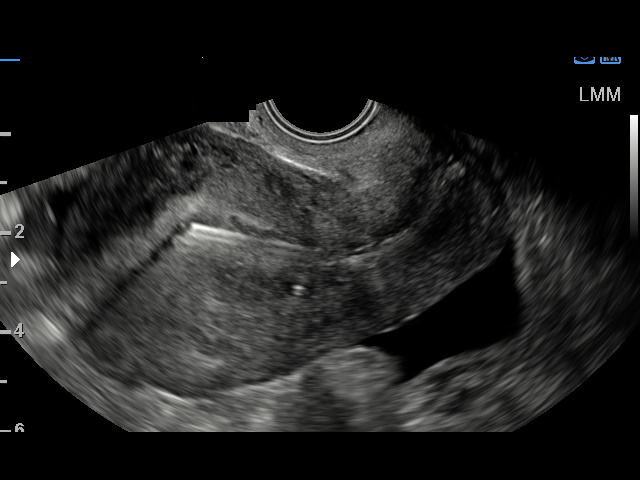

[15 of 28 positions shown; findings below may reference images not displayed]

FINDINGS: Intrauterine gestational sac: Not visualized.

Yolk sac:  Not visualized.

Embryo:  Not visualized.

Cardiac Activity: Not visualized.

Maternal uterus/adnexae: Intrauterine device is noted in endometrial
canal. And other linear echogenicity is noted transversely in the
posterior myometrium which may represent remnant of previous
intrauterine device. Small amount of free fluid is noted which may
be physiologic. Left ovary is not visualized due to overlying bowel
gas. Cyst is noted in right ovary most consistent with corpus
luteum.
IMPRESSION: Intrauterine device is noted in endometrial canal, with probable
remnant of previous intrauterine device seen in posterior
myometrium. No intrauterine fluid collection or gestational sac is
noted. In a pregnant patient, the differential includes early
intrauterine gestation, missed abortion or nonvisualized ectopic
pregnancy. Correlation with beta HCG levels as well as follow-up
ultrasound in 7-10 days is recommended.

## 2016-05-13 IMAGING — US US OB COMP LESS 14 WK
1 series · 15 of 28 positions shown · non-contrast
Comparison: 02/07/2015

CLINICAL DATA: Vaginal bleeding during pregnancy, antepartum
(OEV-F0-CM). bleeding began 1 hour ago. Gestational age based on the
last menstrual period is 10 weeks and 4 days.

EXAM:
OBSTETRIC <14 WK ULTRASOUND
TECHNIQUE: Transabdominal ultrasound was performed for evaluation of the
gestation as well as the maternal uterus and adnexal regions.

[Series 1: us ob comp less 14 wk · 15 of 56 slices shown]
[im 1/56]
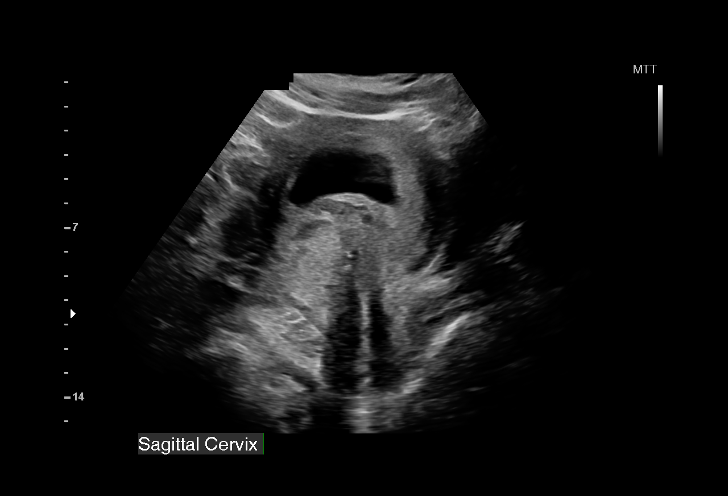
[im 5/56]
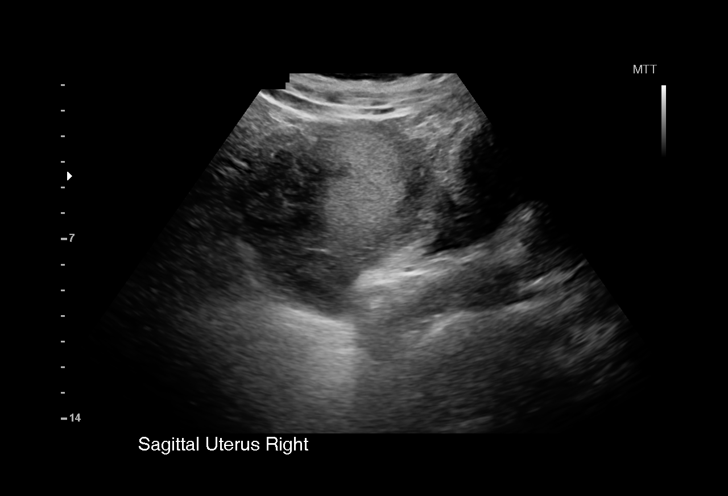
[im 9/56]
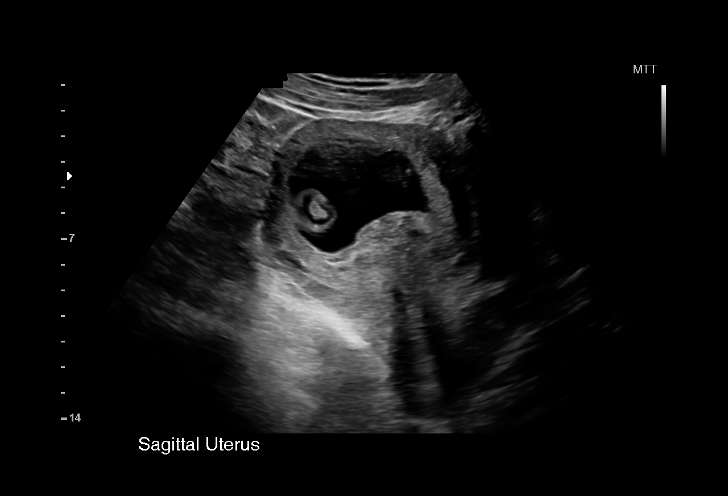
[im 13/56]
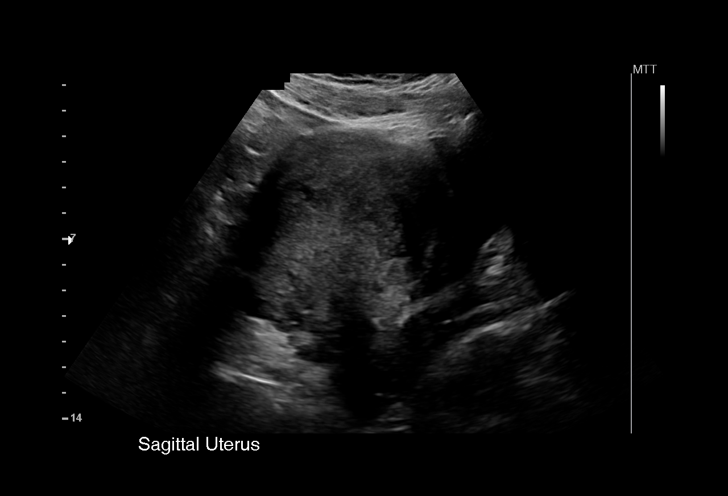
[im 17/56]
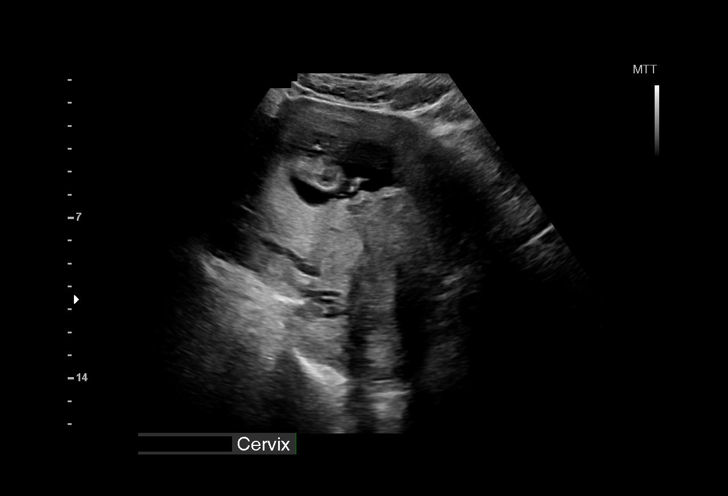
[im 21/56]
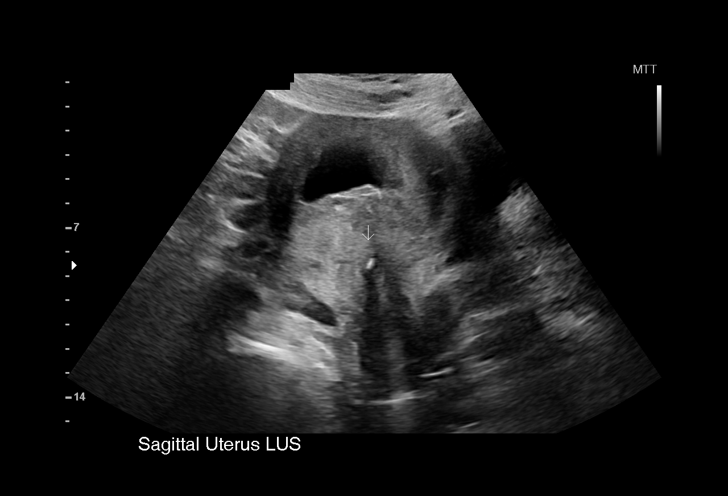
[im 25/56]
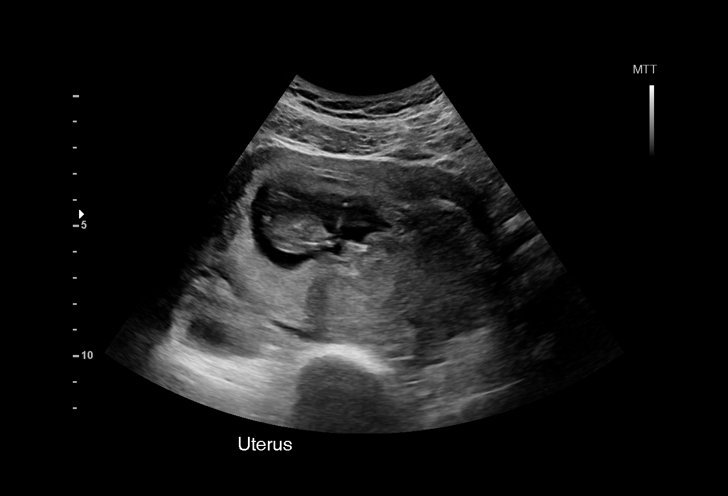
[im 29/56]
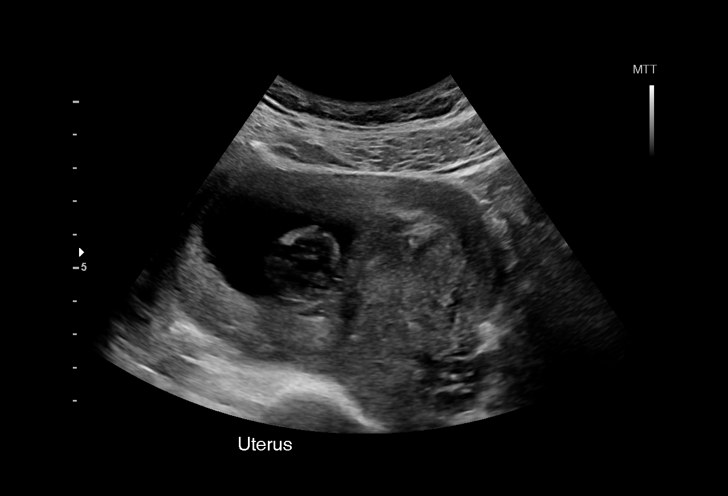
[im 31/56]
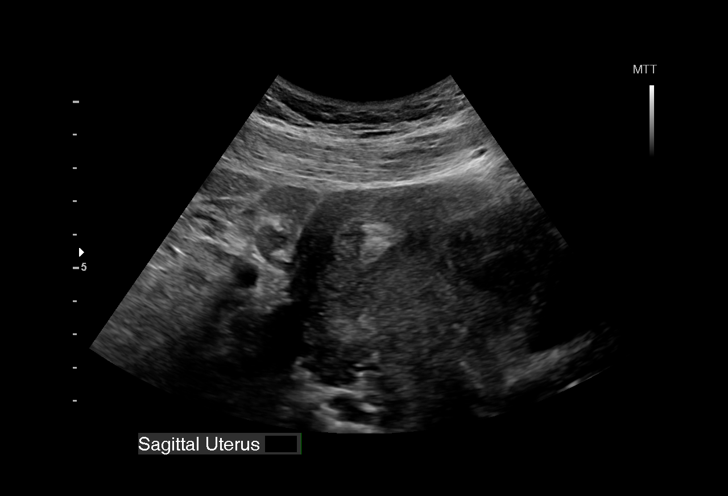
[im 35/56]
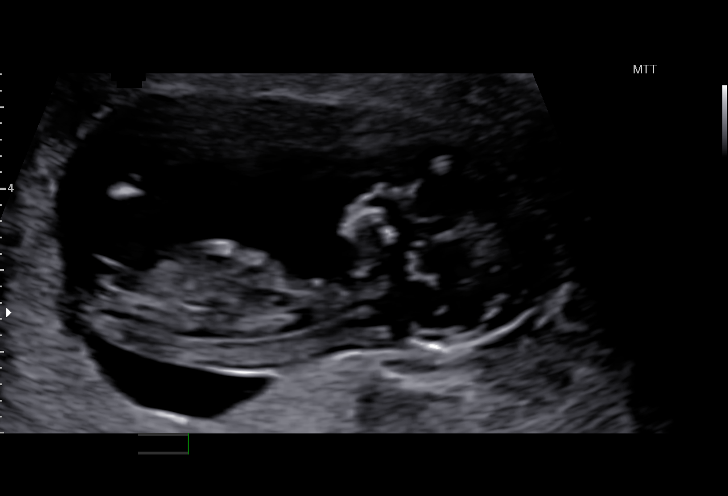
[im 39/56]
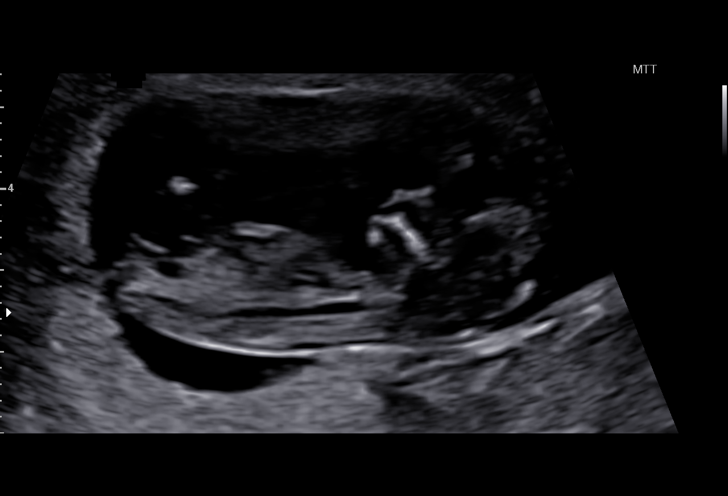
[im 43/56]
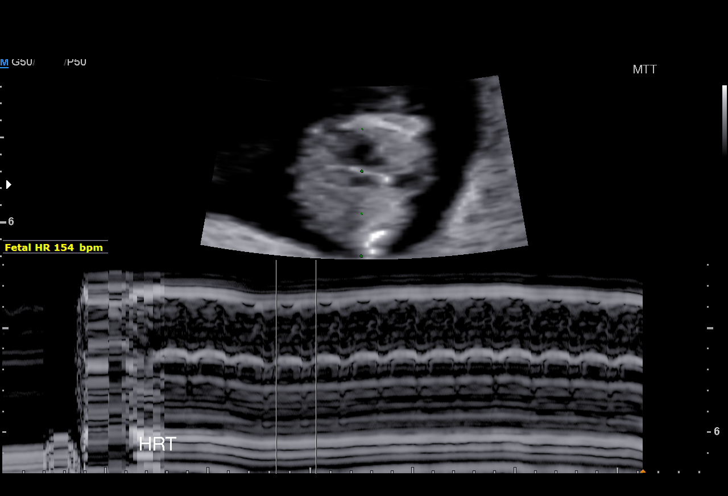
[im 47/56]
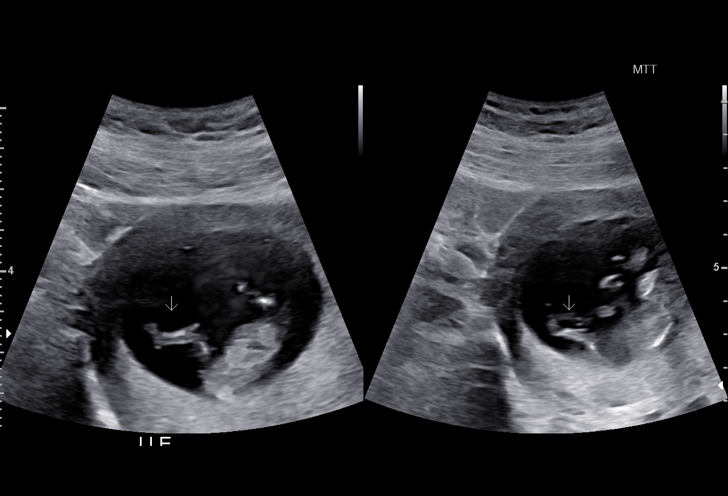
[im 51/56]
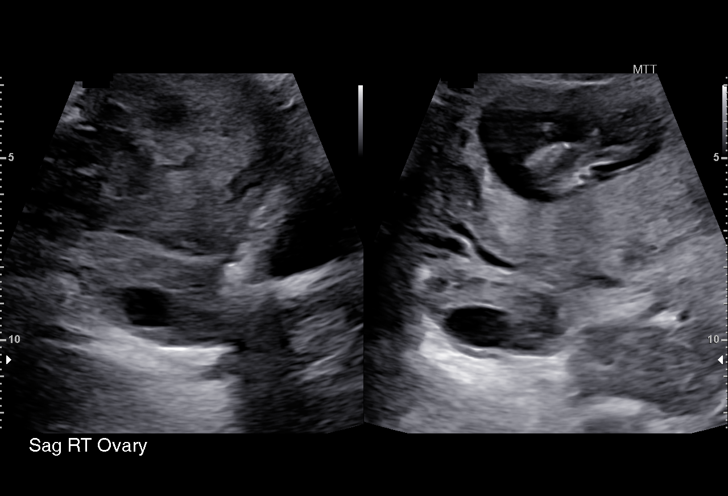
[im 56/56]
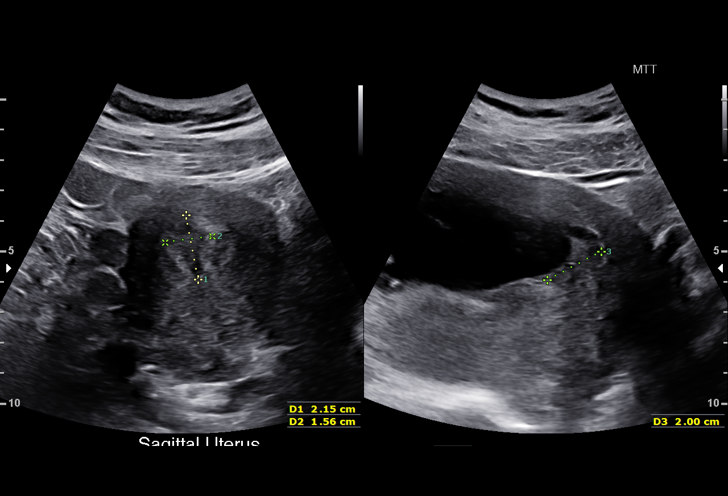

[15 of 28 positions shown; findings below may reference images not displayed]

FINDINGS: Intrauterine gestational sac: Visualized/normal in shape.

Yolk sac:  Yes

Embryo:  Yes

Cardiac Activity: Yes

Heart Rate: 154 bpm

CRL:   55.6  mm   12 w 1 d                  US EDC: 10/08/2015

Subchorionic hemorrhage:  Small subchorionic hemorrhage

Maternal uterus/adnexae: No uterine masses. Remnant of IUD suggested
by an echogenic focus in the lower uterine segment. Cervix is
closed. Normal ovaries. No adnexal masses. No free fluid.
IMPRESSION: 1. Single live intrauterine pregnancy with a measured gestational
age of 12 weeks 1 day. Small subchronic hemorrhage.
2. Small malignant evident lower uterine segment.
3. Normal ovaries and adnexa.

## 2023-07-17 ENCOUNTER — Ambulatory Visit (INDEPENDENT_AMBULATORY_CARE_PROVIDER_SITE_OTHER): Payer: No Typology Code available for payment source

## 2023-07-17 ENCOUNTER — Ambulatory Visit (HOSPITAL_COMMUNITY)
Admission: EM | Admit: 2023-07-17 | Discharge: 2023-07-17 | Disposition: A | Discharge status: Home / Self Care | Payer: No Typology Code available for payment source | Attending: Internal Medicine | Admitting: Internal Medicine

## 2023-07-17 ENCOUNTER — Encounter (HOSPITAL_COMMUNITY): Payer: Self-pay | Admitting: Emergency Medicine

## 2023-07-17 ENCOUNTER — Ambulatory Visit (HOSPITAL_COMMUNITY): Payer: Self-pay | Admitting: Internal Medicine

## 2023-07-17 ENCOUNTER — Other Ambulatory Visit: Payer: Self-pay

## 2023-07-17 DIAGNOSIS — R1011 Right upper quadrant pain: Secondary | ICD-10-CM

## 2023-07-17 LAB — POCT URINALYSIS DIP (MANUAL ENTRY)
Glucose, UA: NEGATIVE mg/dL
Ketones, POC UA: NEGATIVE mg/dL
Leukocytes, UA: NEGATIVE
Nitrite, UA: NEGATIVE
Protein Ur, POC: 30 mg/dL — AB
Spec Grav, UA: >=1.03 — AB (ref 1.010–1.025)
Urobilinogen, UA: 1 U/dL
pH, UA: 6 (ref 5.0–8.0)

## 2023-07-17 NOTE — ED Provider Notes (Signed)
 [facility]    CSN: [id number] Arrival date & time: 07/17/23  1749      History   Chief Complaint Chief Complaint  Patient presents with   Abdominal Pain    HPI Kristina Yu is a 43 y.o. female who presents with RUQ intermittent abdominal pain provoked with eating meals and today she got worse and seemed triggered with drinking [place] . She denies history of constipation, fever, N/V. The pain does not radiate to her back.  Has been using OTC meds for constipation with minimal results and has not helped. Denies UTI symptoms.     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 Authorizing Provider  ibuprofen  (ADVIL ,MOTRIN ) 600 MG tablet Take 1 tablet (600 mg total) by mouth every 6 (six) hours. 10/07/15   Rogene Claude, MD    Family History History reviewed. No pertinent family history.  Social History Social History   Tobacco Use   Smoking status: Every Day    Current packs/day: 0.00    Types: Cigarettes    Last attempt to quit: 01/31/2015    Years since quitting: 8.4   Smokeless tobacco: Never  Vaping Use   Vaping status: Never Used  Substance Use Topics   Alcohol use: No   Drug use: No     Allergies   Amoxicillin   Review  of Systems Review of Systems As noted in HPI  Physical Exam Triage Vital Signs ED Triage Vitals  Encounter Vitals Group     BP 07/17/23 1828 118/70     Systolic BP Percentile --      Diastolic BP Percentile --      Pulse Rate 07/17/23 1828 72     Resp 07/17/23 1828 20     Temp 07/17/23 1828 98.3 F (36.8 C)     Temp Source 07/17/23 1828 Oral     SpO2 07/17/23 1828 98 %     Weight --      Height --      Head Circumference --      Peak Flow --      Pain Score 07/17/23 1825 3     Pain Loc --      Pain Education --      Exclude from Growth Chart --    No data found.  Updated Vital Signs BP 118/70 (BP Location: Left Arm)   Pulse 72   Temp 98.3 F (  36.8 C) (Oral)   Resp 20   LMP 06/19/2023   SpO2 98%   Visual Acuity Right Eye Distance:   Left Eye Distance:   Bilateral Distance:    Right Eye Near:   Left Eye Near:    Bilateral Near:     Physical Exam Vitals and nursing note reviewed.  Constitutional:      General: She is not in acute distress.    Appearance: She is obese. She is not toxic-appearing.  Eyes:     General: No scleral icterus.    Extraocular Movements: Extraocular movements intact.  Pulmonary:     Effort: Pulmonary effort is normal.  Abdominal:     General: Abdomen is scaphoid. Bowel sounds are normal. There is no distension.     Palpations: Abdomen is soft. There is no mass.     Tenderness: There is abdominal tenderness. There is no right CVA tenderness, left CVA tenderness or guarding.     Hernia: No hernia is present.     Comments: She is mostly tender on R mid abdomen more than RUQ.   Musculoskeletal:        General: Normal range of motion.     Cervical back: Neck supple.  Skin:    General: Skin is warm and dry.     Findings: No rash.  Neurological:     Mental Status: She is alert and oriented to person, place, and time.     Gait: Gait normal.  Psychiatric:        Mood and Affect: Mood normal.        Behavior: Behavior normal.         Thought Content: Thought content normal.        Judgment: Judgment normal.    UC Treatments / Results  Labs (all labs ordered are listed, but only abnormal results are displayed) Labs Reviewed  POCT URINALYSIS DIP (MANUAL ENTRY) - Abnormal; Notable for the following components:      Result Value   Color, UA orange (*)    Bilirubin, UA small (*)    Spec Grav, UA >=1.030 (*)    Blood, UA trace-intact (*)    Protein Ur, POC =30 (*)    All other components within normal limits    EKG   Radiology No results found.  Procedures Procedures (including critical care time)  Medications Ordered in UC Medications - No data to display  Initial Impression / Assessment and Plan / UC Course  I have reviewed the triage vital signs and the nursing notes.  Pertinent labs & imaging results that were available during my care of the patient were reviewed by me and considered in my medical decision making (see chart for details).  RUQ pain  Advised to go to ER for further tests.    Final Clinical Impressions(s) / UC Diagnoses   Final diagnoses:  RUQ pain     Discharge Instructions      Your urine shows no sings of infection, but is showing small amount of a chemical called bilirubin which can come from your liver and some protein. The xray does not show stool impaction. Please go to the ER to have further tests like blood work and ultrasound or cat scan done.    ED Prescriptions   None    PDMP not reviewed this encounter.   Vonda Guadeloupe, PA-C 07/17/23 1926

## 2023-07-17 NOTE — ED Notes (Signed)
 Patient is being discharged from the Urgent Care and sent to the Emergency Department via POV . Per Lamond Pilot, [place], patient is in need of higher level of care due to limited resources. Patient is aware and verbalizes understanding of plan of care.  Vitals:   07/17/23 1828  BP: 118/70  Pulse: 72  Resp: 20  Temp: 98.3 F (36.8 C)  SpO2: 98%

## 2023-07-17 NOTE — ED Triage Notes (Signed)
 Symptoms for a week, intermittently.  Points to right upper quadrant.  Patient has taken laxatives, mirilax, enema and patient reports a small amount of stool  Patient reports she notices pain when eating meals, small fruit is not a problem.    No new urinary or vaginal issues

## 2023-07-17 NOTE — Discharge Instructions (Signed)
 Your urine shows no sings of infection, but is showing small amount of a chemical called bilirubin which can come from your liver and some protein. The xray does not show stool impaction. Please go to the ER to have further tests like blood work and ultrasound or cat scan done.
